# Patient Record
Sex: Male | Born: 1980 | Race: White | Hispanic: No | Marital: Married | Smoking: Never smoker
Health system: Southern US, Community
[De-identification: ages and names within clinical notes are randomized; demographics above are authoritative.]

## PROBLEM LIST (undated history)

## (undated) DIAGNOSIS — N200 Calculus of kidney: Secondary | ICD-10-CM

## (undated) DIAGNOSIS — M109 Gout, unspecified: Secondary | ICD-10-CM

## (undated) DIAGNOSIS — M1990 Unspecified osteoarthritis, unspecified site: Secondary | ICD-10-CM

## (undated) DIAGNOSIS — J40 Bronchitis, not specified as acute or chronic: Secondary | ICD-10-CM

## (undated) DIAGNOSIS — I10 Essential (primary) hypertension: Secondary | ICD-10-CM

## (undated) HISTORY — PX: APPENDECTOMY: SHX54

---

## 2007-02-01 ENCOUNTER — Ambulatory Visit: Payer: Self-pay | Admitting: Family Medicine

## 2007-12-29 ENCOUNTER — Ambulatory Visit (HOSPITAL_COMMUNITY): Admission: RE | Admit: 2007-12-29 | Discharge: 2007-12-29 | Payer: Self-pay | Admitting: Urology

## 2008-01-11 ENCOUNTER — Ambulatory Visit (HOSPITAL_COMMUNITY): Admission: RE | Admit: 2008-01-11 | Discharge: 2008-01-11 | Payer: Self-pay | Admitting: Urology

## 2008-01-12 ENCOUNTER — Ambulatory Visit (HOSPITAL_COMMUNITY): Admission: RE | Admit: 2008-01-12 | Discharge: 2008-01-12 | Payer: Self-pay | Admitting: Urology

## 2009-03-14 ENCOUNTER — Emergency Department (HOSPITAL_BASED_OUTPATIENT_CLINIC_OR_DEPARTMENT_OTHER): Admission: EM | Admit: 2009-03-14 | Discharge: 2009-03-14 | Payer: Self-pay | Admitting: Emergency Medicine

## 2009-04-17 IMAGING — CT CT ABDOMEN W/O CM
1 of 2 series · 16 of 32 positions shown, 20 images · non-contrast
Comparison: None available

CT ABDOMEN

CLINICAL DATA: Right flank pain, hematuria

CT ABDOMEN AND PELVIS WITHOUT CONTRAST
TECHNIQUE: Multidetector CT imaging of the abdomen and pelvis was
performed following the standard
protocol without intravenous contrast.

[Series 2: stone over (id) 5.0 b40f · axial · 0.86mm/px · z∈[-527,-67]mm · 16 of 102 slices shown, 20 images]
[im 5/102  soft-tissue]
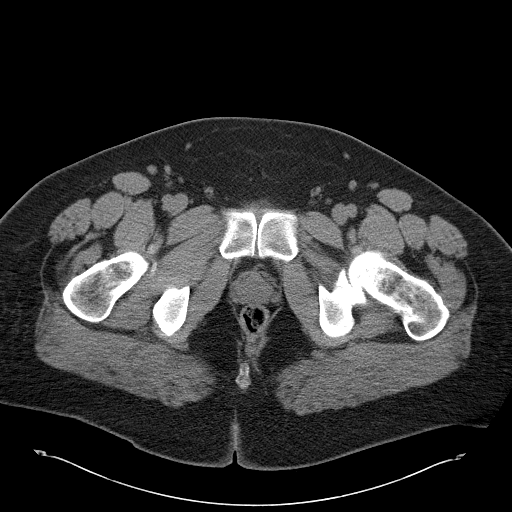
[im 5/102  bone]
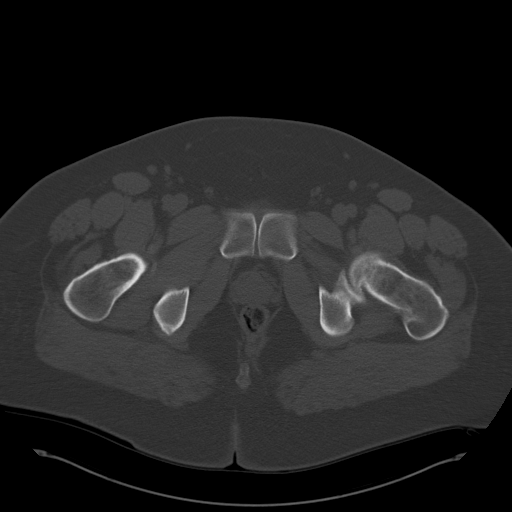
[im 13/102  soft-tissue]
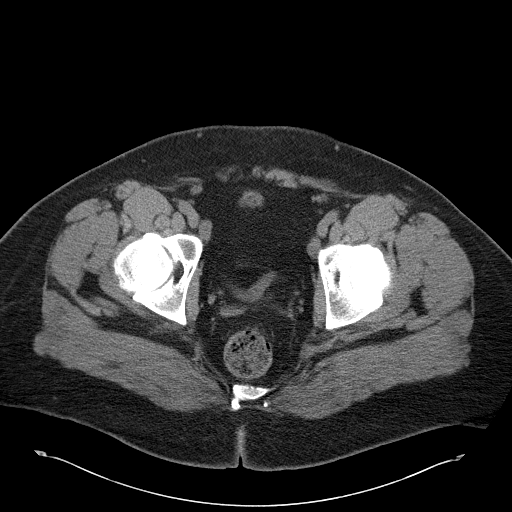
[im 22/102  soft-tissue]
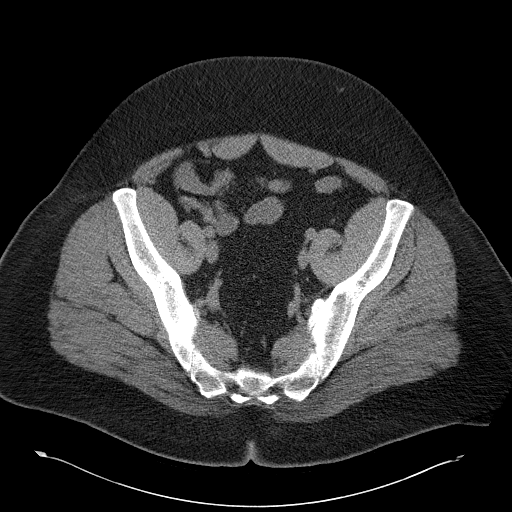
[im 26/102  soft-tissue]
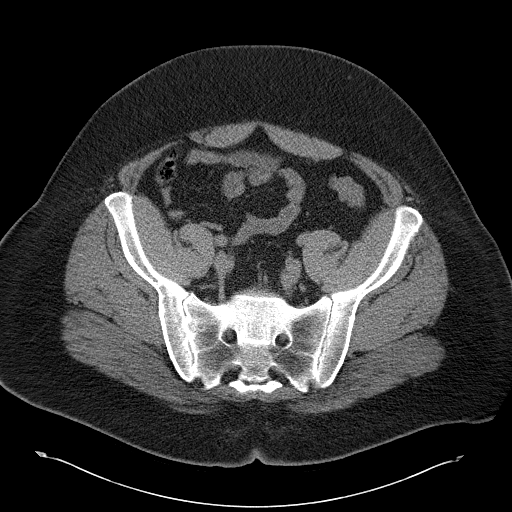
[im 34/102  soft-tissue]
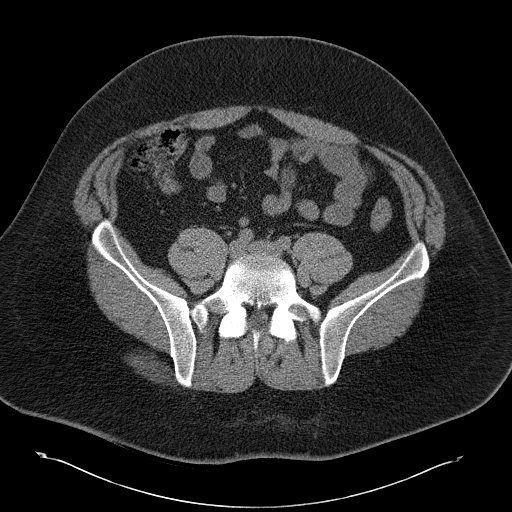
[im 43/102  soft-tissue]
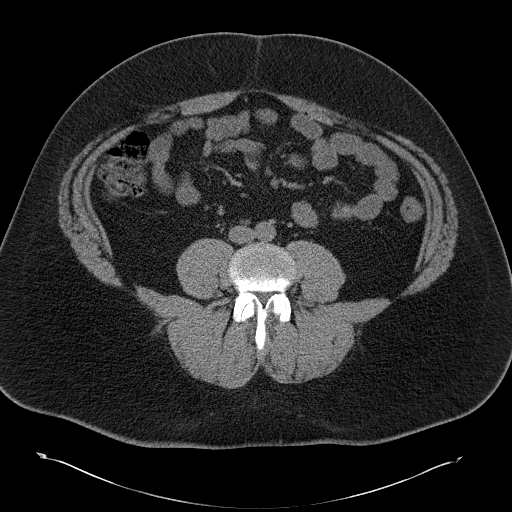
[im 47/102  soft-tissue]
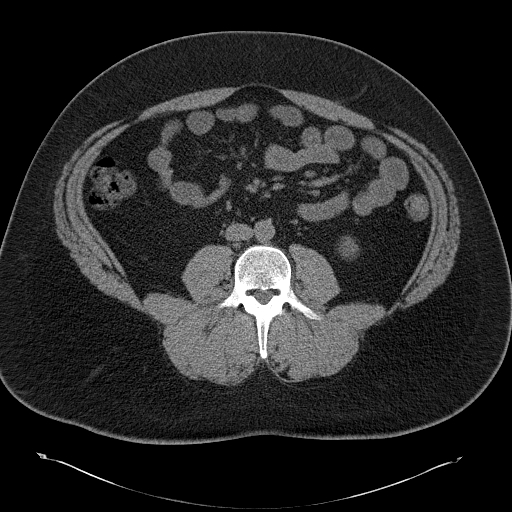
[im 55/102  soft-tissue]
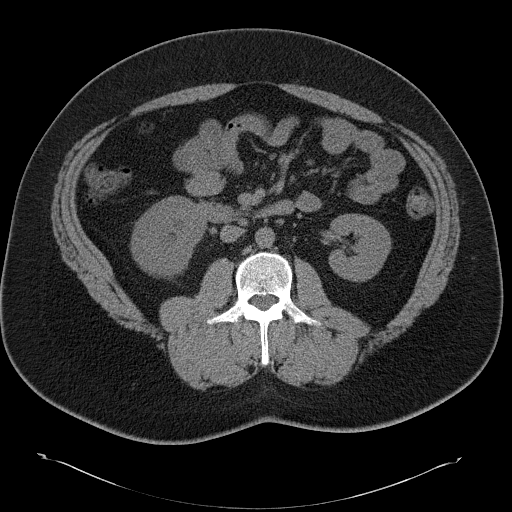
[im 59/102  soft-tissue]
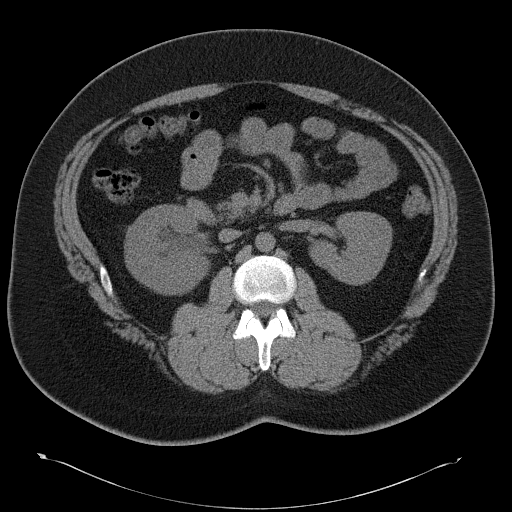
[im 59/102  bone]
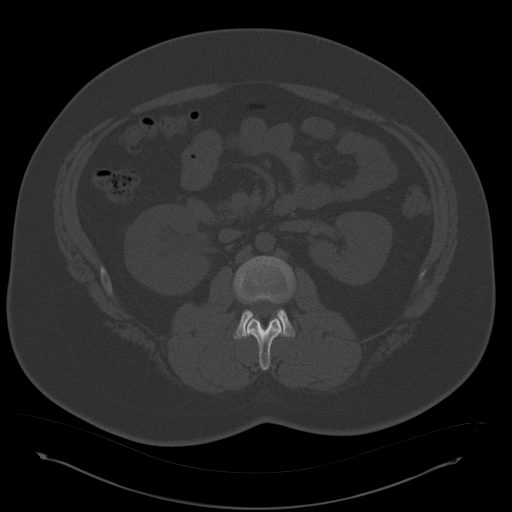
[im 68/102  soft-tissue]
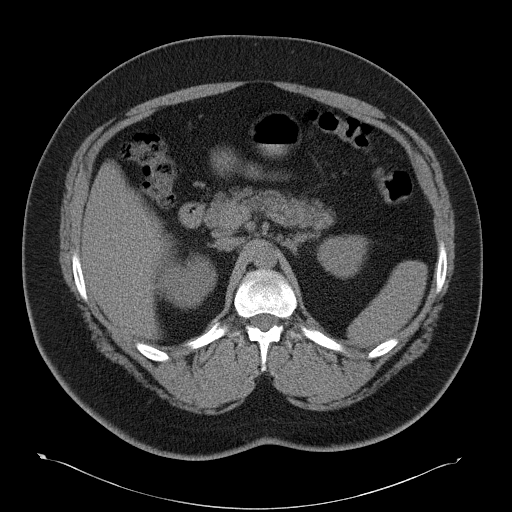
[im 76/102  soft-tissue]
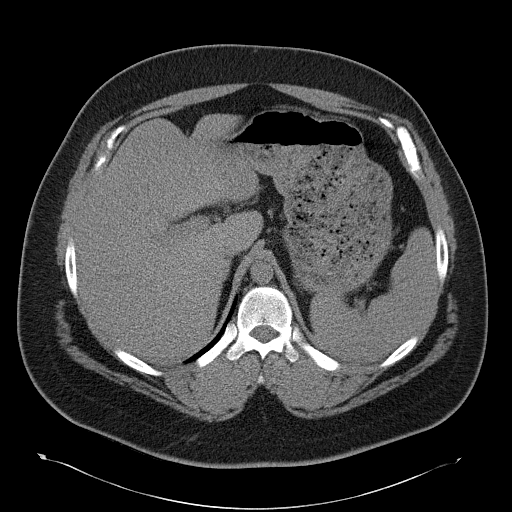
[im 80/102  soft-tissue]
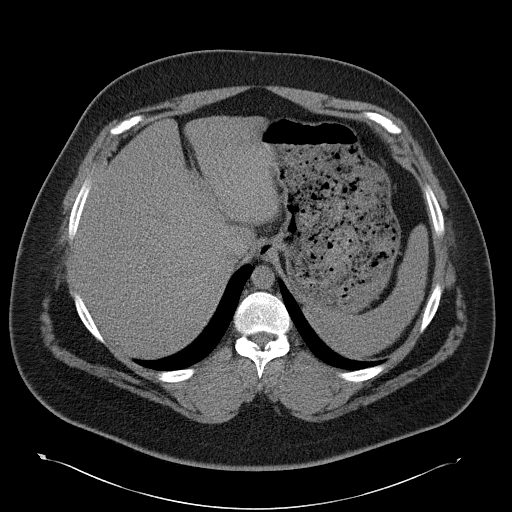
[im 85/102  lung]
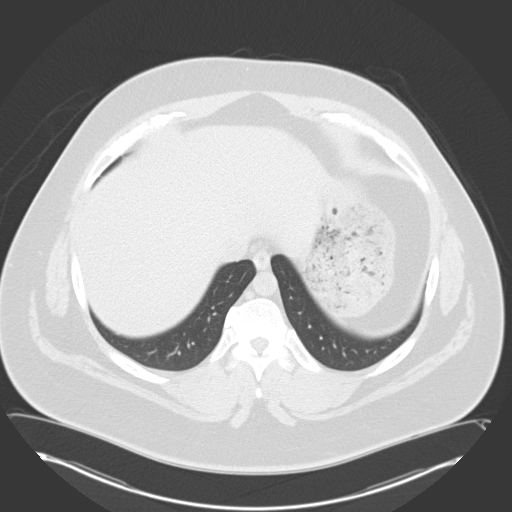
[im 89/102  soft-tissue]
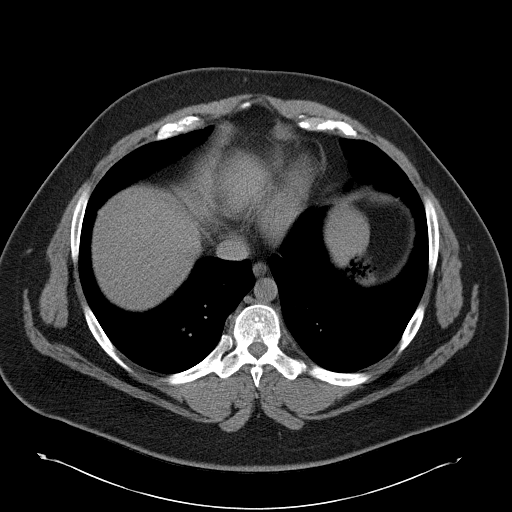
[im 89/102  lung]
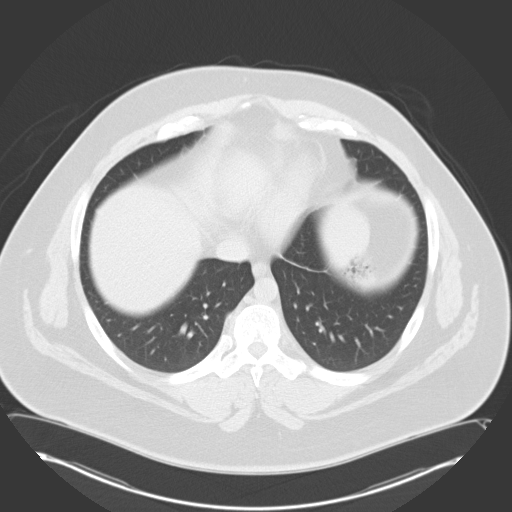
[im 93/102  lung]
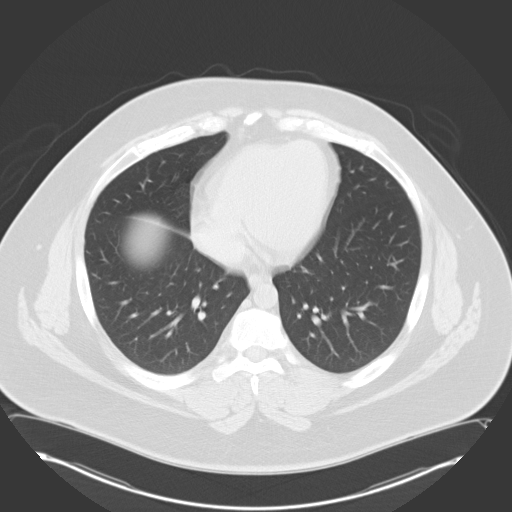
[im 97/102  soft-tissue]
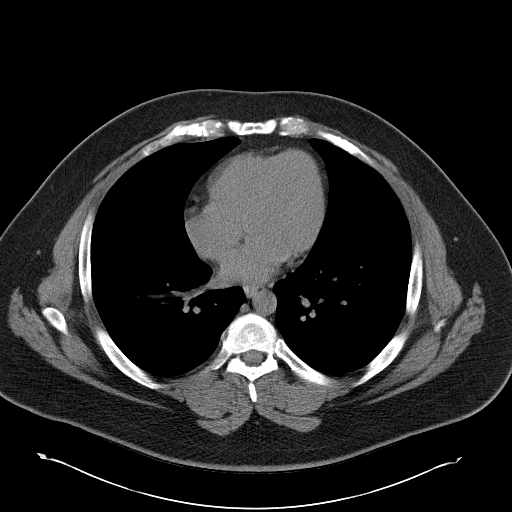
[im 97/102  lung]
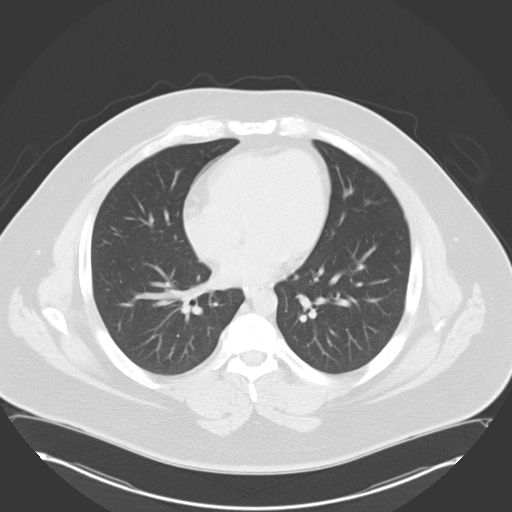

[16 of 32 positions shown; findings below may reference images not displayed]

FINDINGS: Visualized lung bases clear.  There is a  6 mm calculus
at  the right UPJ with mild hydronephrosis.  Unremarkable
noncontrast evaluation of left kidney, spleen, adrenal glands,
liver, gallbladder, pancreas, aorta, small bowel.  No free air.  No
ascites.  No adenopathy.
IMPRESSION: 1.  Obstructing right 6 mm UPJ calculus with hydronephrosis.

CT PELVIS
FINDINGS: The colon is nondistended, unremarkable.       Urinary
bladder is nondistended.  No free fluid.  No adenopathy.
IMPRESSION: 1.  Unremarkable CT pelvis

## 2009-05-01 IMAGING — CR DG ABDOMEN 1V
2 series · 2 of 2 positions shown · non-contrast
Comparison: One-view abdomen x-ray yesterday.  CT urogram
12/29/2007.

CLINICAL DATA: ESWL for right proximal ureteral calculus.

ABDOMEN - 1 VIEW 01/12/2008:

[view not recorded (1 of 2)]
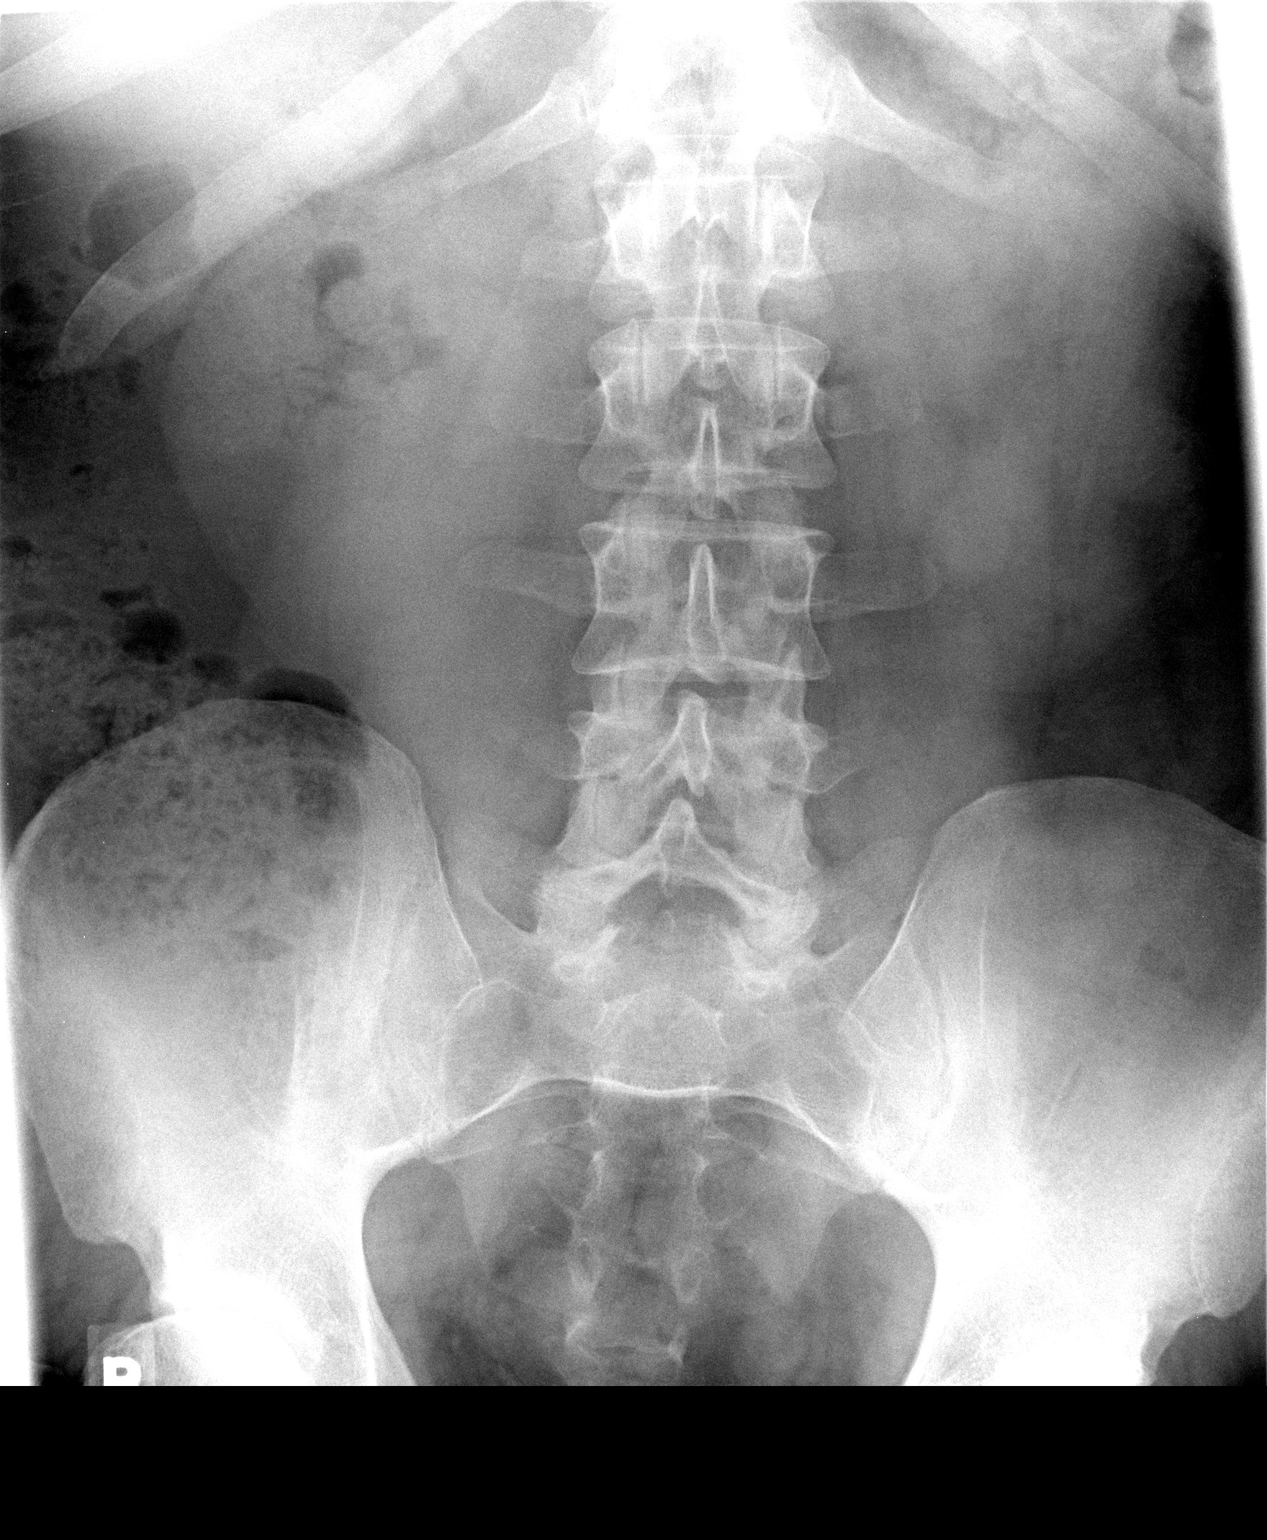

[view not recorded (2 of 2)]
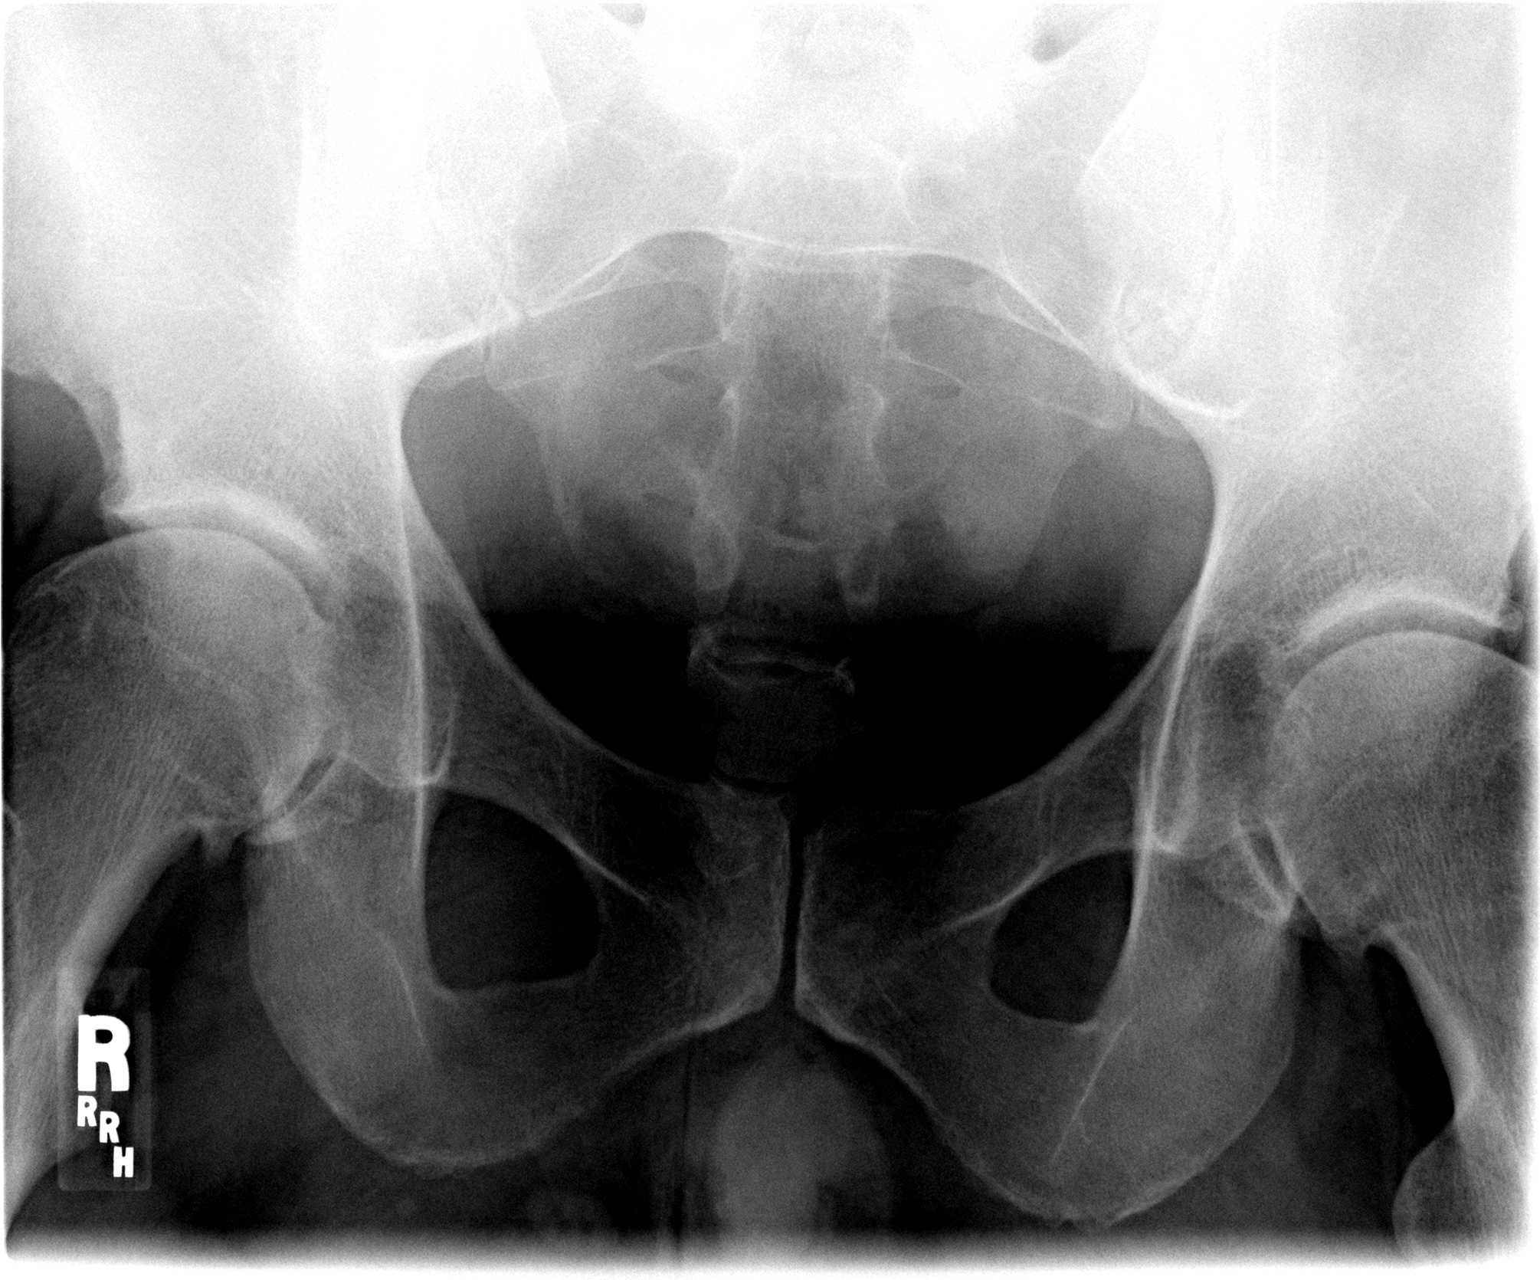

[2 of 2 positions shown; findings below may reference images not displayed]

FINDINGS: Proximal right ureteral calculus identified previously no
longer visible.  Linear calcifications in the right side of the low
pelvis may reflect small stone fragments in the right ureter.  No
visible opaque urinary tract calculi elsewhere.  Bowel gas pattern
unremarkable.  Regional skeleton intact.
IMPRESSION: Proximal right ureteral calculus no longer visible.  Possible small
stone fragments in the distal right ureter.

## 2009-06-06 ENCOUNTER — Emergency Department (HOSPITAL_BASED_OUTPATIENT_CLINIC_OR_DEPARTMENT_OTHER): Admission: EM | Admit: 2009-06-06 | Discharge: 2009-06-06 | Payer: Self-pay | Admitting: Emergency Medicine

## 2009-10-24 ENCOUNTER — Emergency Department (HOSPITAL_BASED_OUTPATIENT_CLINIC_OR_DEPARTMENT_OTHER): Admission: EM | Admit: 2009-10-24 | Discharge: 2009-10-24 | Payer: Self-pay | Admitting: Emergency Medicine

## 2009-12-26 ENCOUNTER — Emergency Department (HOSPITAL_BASED_OUTPATIENT_CLINIC_OR_DEPARTMENT_OTHER): Admission: EM | Admit: 2009-12-26 | Discharge: 2009-12-26 | Payer: Self-pay | Admitting: Emergency Medicine

## 2011-01-20 NOTE — H&P (Signed)
NAME:  Brian Page, Brian Page                ACCOUNT NO.:  [id number]   MEDICAL RECORD NO.:  [id number]          PATIENT TYPE:  AMB   LOCATION:  DAY                           FACILITY:  [facility]   PHYSICIAN:  Dennie Maizes, M.D.   DATE OF BIRTH:  1980/12/06   DATE OF ADMISSION:  01/11/2008  DATE OF DISCHARGE:  LH                              HISTORY & PHYSICAL   CHIEF COMPLAINT:  Hematuria, penile pain.   HISTORY OF PRESENT ILLNESS:  This 30 year old male was evaluated in the  emergency room at [facility] on December 19, 2007, with a  history of penile pain during coughing and intermittent hematuria.  He  has a past history of urolithiasis.  He has passed several stones in the  past.  He also has been having intermittent right flank pain for a few  days.  The patient denied having voiding difficulty, fever or chills.  Further evaluation was done with a CT scan of abdomen and pelvis.  This  revealed a 6-mm size stone in the right kidney at the ureteropelvic  junction with obstruction and mild hydronephrosis.  The patient is  brought to the short-stay center today for extracorporeal shock wave  lithotripsy of right renal calculus.   PAST MEDICAL HISTORY:  1. History of recurrent urolithiasis.  2. Gastroesophageal reflux disease.  3. Status post appendectomy.   MEDICATIONS:  Nexium 40 mg p.o. daily   ALLERGIES:  No known drug allergies.   FAMILY HISTORY:  Positive for kidney stones.   PHYSICAL EXAMINATION:  VITAL SIGNS:  Height 6 feet 1 inch, weight 300  pounds.  HEENT:  Normal.  NECK:  No masses.  LUNGS:  Clear to auscultation.  HEART:  Regular rate and rhythm.  No murmurs.  ABDOMEN:  Soft, no palpable flank mass.  Mild right costovertebral angle  tenderness is noted.  Bladder not palpable.  GENITALIA:  Penis and testes are normal.   IMPRESSION:  1. Right renal calculus with obstruction.  2. Right flank pain.  3. Hematuria.   PLAN:  Extracorporeal shock wave  lithotripsy with 6-mm size, right renal  calculus with IV sedation in the hospital.  I have discussed with the  patient regarding the diagnosis, operative details and outcome, possible  risks and complications and he has agreed for the procedure to be done.      Dennie Maizes, M.D.  Electronically Signed     SK/MEDQ  D:  01/11/2008  T:  01/11/2008  Job:  [id number]   cc:   [facility]  Fax: [phone]

## 2012-10-04 ENCOUNTER — Emergency Department (HOSPITAL_COMMUNITY)
Admission: EM | Admit: 2012-10-04 | Discharge: 2012-10-04 | Disposition: A | Discharge status: Home / Self Care | Payer: BC Managed Care – PPO | Attending: Emergency Medicine | Admitting: Emergency Medicine

## 2012-10-04 ENCOUNTER — Encounter (HOSPITAL_COMMUNITY): Payer: Self-pay | Admitting: *Deleted

## 2012-10-04 DIAGNOSIS — I10 Essential (primary) hypertension: Secondary | ICD-10-CM | POA: Insufficient documentation

## 2012-10-04 DIAGNOSIS — J329 Chronic sinusitis, unspecified: Secondary | ICD-10-CM | POA: Insufficient documentation

## 2012-10-04 DIAGNOSIS — Z8709 Personal history of other diseases of the respiratory system: Secondary | ICD-10-CM | POA: Insufficient documentation

## 2012-10-04 DIAGNOSIS — H9209 Otalgia, unspecified ear: Secondary | ICD-10-CM | POA: Insufficient documentation

## 2012-10-04 HISTORY — DX: Bronchitis, not specified as acute or chronic: J40

## 2012-10-04 HISTORY — DX: Essential (primary) hypertension: I10

## 2012-10-04 MED ORDER — BENZONATATE 100 MG PO CAPS
200.0000 mg | ORAL_CAPSULE | Freq: Once | ORAL | Status: AC
  Administered 2012-10-04: 200 mg via ORAL
  Filled 2012-10-04: qty 2

## 2012-10-04 MED ORDER — AMOXICILLIN 500 MG PO CAPS: 500.0000 mg | ORAL_CAPSULE | Freq: Three times a day (TID) | ORAL | Status: AC

## 2012-10-04 MED ORDER — BENZONATATE 100 MG PO CAPS: 100.0000 mg | ORAL_CAPSULE | Freq: Three times a day (TID) | ORAL | Status: DC | PRN

## 2012-10-04 NOTE — ED Notes (Signed)
Cough,dark brown sputum.  Nasal congestion,  No sore throat,  No fever.  No NVD.

## 2012-10-04 NOTE — ED Provider Notes (Signed)
Medical screening examination/treatment/procedure(s) were performed by non-physician practitioner and as supervising physician I was immediately available for consultation/collaboration.   Hye Trawick III, MD 10/04/12 1913 

## 2012-10-04 NOTE — ED Provider Notes (Signed)
History     CSN: [id number]  Arrival date & time 10/04/12  1512   First MD Initiated Contact with Patient 10/04/12 1528      Chief Complaint  Patient presents with  . Cough    (Consider location/radiation/quality/duration/timing/severity/associated sxs/prior treatment) HPI Comments: Brian Page presents with a 5 day history of nasal congestion with thick green discharge,  Post nasal drip and cough productive also of thick green sputum.  He also reports pressure sensation in the left ear without tinnitus or decreased hearing acuity.  He denies chest pain,  Shortness of breath, nausea, vomiting and abdominal pain.  He has taken dayquil without relief of symptoms.  He denies headache,  Visual changes, dizziness and sore throat.  The history is provided by the patient.    Past Medical History  Diagnosis Date  . Bronchitis   . Hypertension     Past Surgical History  Procedure Date  . Appendectomy     History reviewed. No pertinent family history.  History  Substance Use Topics  . Smoking status: Never Smoker   . Smokeless tobacco: Current User    Types: Chew  . Alcohol Use: No      Review of Systems  Constitutional: Negative for fever and chills.  HENT: Positive for ear pain, congestion, rhinorrhea and sinus pressure. Negative for sore throat, facial swelling, neck pain, neck stiffness and ear discharge.   Eyes: Negative.   Respiratory: Positive for cough. Negative for chest tightness, shortness of breath, wheezing and stridor.   Cardiovascular: Negative for chest pain.  Gastrointestinal: Negative for nausea and abdominal pain.  Genitourinary: Negative.   Musculoskeletal: Negative for joint swelling and arthralgias.  Skin: Negative.  Negative for rash and wound.  Neurological: Negative for dizziness, weakness, light-headedness, numbness and headaches.  Hematological: Negative.   Psychiatric/Behavioral: Negative.     Allergies  Review of patient's allergies  indicates no known allergies.  Home Medications   Current Outpatient Rx  Name  Route  Sig  Dispense  Refill  . DAYQUIL PO   Oral   Take by mouth.           BP 148/91  Pulse 85  Temp 98 F (36.7 C) (Oral)  SpO2 100%  Physical Exam  Nursing note and vitals reviewed. Constitutional: He appears well-developed and well-nourished.  HENT:  Head: Normocephalic and atraumatic.  Right Ear: Tympanic membrane, external ear and ear canal normal.  Left Ear: External ear and ear canal normal. Tympanic membrane is injected. Tympanic membrane is not bulging.  Nose: Left sinus exhibits frontal sinus tenderness.  Mouth/Throat: Uvula is midline, oropharynx is clear and moist and mucous membranes are normal.  Eyes: Conjunctivae normal are normal.  Neck: Normal range of motion.  Cardiovascular: Normal rate, regular rhythm, normal heart sounds and intact distal pulses.   Pulmonary/Chest: Effort normal and breath sounds normal. He has no wheezes.  Abdominal: Soft. Bowel sounds are normal. There is no tenderness.  Musculoskeletal: Normal range of motion.  Neurological: He is alert.  Skin: Skin is warm and dry.  Psychiatric: He has a normal mood and affect.    ED Course  Procedures (including critical care time)  Labs Reviewed - No data to display No results found.   1. Sinusitis       MDM  Possible early left otitis,  In addition to acute sinusitis.  Pt prescribed amoxil,  Tessalon for cough.  Encouraged continued dayquil for decongestant,  Humidifier.  Lungs clear on exam today,  Considered cxr,  But deferred.  Doubt pulmonary infection.        Burgess Amor, [place] 10/04/12 [phone]

## 2012-11-08 ENCOUNTER — Encounter (HOSPITAL_COMMUNITY): Payer: Self-pay | Admitting: *Deleted

## 2012-11-08 ENCOUNTER — Emergency Department (HOSPITAL_COMMUNITY)
Admission: EM | Admit: 2012-11-08 | Discharge: 2012-11-08 | Disposition: A | Discharge status: Home / Self Care | Payer: BC Managed Care – PPO | Attending: Emergency Medicine | Admitting: Emergency Medicine

## 2012-11-08 DIAGNOSIS — I10 Essential (primary) hypertension: Secondary | ICD-10-CM | POA: Insufficient documentation

## 2012-11-08 DIAGNOSIS — M25579 Pain in unspecified ankle and joints of unspecified foot: Secondary | ICD-10-CM | POA: Insufficient documentation

## 2012-11-08 DIAGNOSIS — Z79899 Other long term (current) drug therapy: Secondary | ICD-10-CM | POA: Insufficient documentation

## 2012-11-08 DIAGNOSIS — M109 Gout, unspecified: Secondary | ICD-10-CM | POA: Insufficient documentation

## 2012-11-08 DIAGNOSIS — Z8709 Personal history of other diseases of the respiratory system: Secondary | ICD-10-CM | POA: Insufficient documentation

## 2012-11-08 DIAGNOSIS — M064 Inflammatory polyarthropathy: Secondary | ICD-10-CM | POA: Insufficient documentation

## 2012-11-08 HISTORY — DX: Gout, unspecified: M10.9

## 2012-11-08 MED ORDER — DEXAMETHASONE 6 MG PO TABS: ORAL_TABLET | ORAL | Status: DC

## 2012-11-08 MED ORDER — HYDROCODONE-ACETAMINOPHEN 5-325 MG PO TABS: 1.0000 | ORAL_TABLET | ORAL | Status: DC | PRN

## 2012-11-08 MED ORDER — INDOMETHACIN 25 MG PO CAPS: 25.0000 mg | ORAL_CAPSULE | Freq: Three times a day (TID) | ORAL | Status: DC | PRN

## 2012-11-08 NOTE — ED Provider Notes (Signed)
History     CSN: [id number]  Arrival date & time 11/08/12  1716   First MD Initiated Contact with Patient 11/08/12 1822      Chief Complaint  Patient presents with  . Foot Pain    (Consider location/radiation/quality/duration/timing/severity/associated sxs/prior treatment) Patient is a 32 y.o. male presenting with lower extremity pain. The history is provided by the patient.  Foot Pain This is a recurrent problem. The current episode started 1 to 4 weeks ago. The problem occurs daily. The problem has been gradually worsening. Associated symptoms include arthralgias. Pertinent negatives include no abdominal pain, chest pain, coughing, fever, neck pain or numbness. The symptoms are aggravated by walking. He has tried acetaminophen for the symptoms. The treatment provided no relief.    Past Medical History  Diagnosis Date  . Bronchitis   . Hypertension   . Gout     Past Surgical History  Procedure Laterality Date  . Appendectomy      History reviewed. No pertinent family history.  History  Substance Use Topics  . Smoking status: Never Smoker   . Smokeless tobacco: Current User    Types: Chew  . Alcohol Use: No      Review of Systems  Constitutional: Negative for fever and activity change.       All ROS Neg except as noted in HPI  HENT: Negative for nosebleeds and neck pain.   Eyes: Negative for photophobia and discharge.  Respiratory: Negative for cough, shortness of breath and wheezing.   Cardiovascular: Negative for chest pain and palpitations.  Gastrointestinal: Negative for abdominal pain and blood in stool.  Genitourinary: Negative for dysuria, frequency and hematuria.  Musculoskeletal: Positive for arthralgias. Negative for back pain.  Skin: Negative.   Neurological: Negative for dizziness, seizures, speech difficulty and numbness.  Psychiatric/Behavioral: Negative for hallucinations and confusion.    Allergies  Review of patient's allergies indicates no  known allergies.  Home Medications   Current Outpatient Rx  Name  Route  Sig  Dispense  Refill  . acetaminophen (TYLENOL) 500 MG tablet   Oral   Take 1,500 mg by mouth daily as needed for pain.         Marland Kitchen dexamethasone (DECADRON) 6 MG tablet      1 po daily with food   7 tablet   0   . HYDROcodone-acetaminophen (NORCO/VICODIN) 5-325 MG per tablet   Oral   Take 1 tablet by mouth every 4 (four) hours as needed for pain.   20 tablet   0   . indomethacin (INDOCIN) 25 MG capsule   Oral   Take 1 capsule (25 mg total) by mouth 3 (three) times daily as needed.   21 capsule   0     BP 137/74  Pulse 85  Temp(Src) 97.4 F (36.3 C) (Oral)  Resp 18  Ht 6\' 1"  (1.854 m)  Wt 285 lb (129.275 kg)  BMI 37.61 kg/m2  SpO2 100%  Physical Exam  Nursing note and vitals reviewed. Constitutional: He is oriented to person, place, and time. He appears well-developed and well-nourished.  Non-toxic appearance.  HENT:  Head: Normocephalic.  Right Ear: Tympanic membrane and external ear normal.  Left Ear: Tympanic membrane and external ear normal.  Eyes: EOM and lids are normal. Pupils are equal, round, and reactive to light.  Neck: Normal range of motion. Neck supple. Carotid bruit is not present.  Cardiovascular: Normal rate, regular rhythm, normal heart sounds, intact distal pulses and normal pulses.  Pulmonary/Chest: Breath sounds normal. No respiratory distress.  Abdominal: Soft. Bowel sounds are normal. There is no tenderness. There is no guarding.  Musculoskeletal: Normal range of motion.  Pain of the MP of the  right 1st  Toe. Mild to mod increase redness. The left foot is warmer than the right. Pulses 2+.. No red streaking. No lesions between toes. No puncture wounds of the plantar surface.  Lymphadenopathy:       Head (right side): No submandibular adenopathy present.       Head (left side): No submandibular adenopathy present.    He has no cervical adenopathy.  Neurological: He  is alert and oriented to person, place, and time. He has normal strength. No cranial nerve deficit or sensory deficit.  Skin: Skin is warm and dry.  Psychiatric: He has a normal mood and affect. His speech is normal.    ED Course  Procedures (including critical care time)  Labs Reviewed - No data to display No results found.   1. Foot pain, right   2. Inflammatory arthritis       MDM  I have reviewed nursing notes, vital signs, and all appropriate lab and imaging results for this patient. Patient presents to the emergency department with pain of the right foot. Particularly pain is noted in the first toe of the right foot. This is been going on at intervals for the past week. The patient denies any recent injury or trauma to the toe. It is of note that the patient has gout history in the left foot.  The history and examination today are consistent with an inflammatory process involving the right foot, possibly gout. The patient is treated with Decadron 6 mg daily, Norco one every 4 hours #20 tablets, and Indocin 25 mg 3 times daily. Patient advised to see a primary physician for additional evaluation.       Kathie Dike, PA-C 11/08/12 [phone]

## 2012-11-08 NOTE — ED Notes (Signed)
Pt c/o right foot pain and swelling. Pt denies injury. Pt has hx of gout in left foot.

## 2012-11-08 NOTE — ED Notes (Signed)
Right foot pain, denies injury, hx of gout in left foot, states pain is same as before

## 2012-11-08 NOTE — ED Provider Notes (Signed)
Medical screening examination/treatment/procedure(s) were performed by non-physician practitioner and as supervising physician I was immediately available for consultation/collaboration.   Donald W Wickline, MD 11/08/12 1919 

## 2013-04-04 ENCOUNTER — Emergency Department (HOSPITAL_COMMUNITY): Payer: BC Managed Care – PPO

## 2013-04-04 ENCOUNTER — Emergency Department (HOSPITAL_COMMUNITY)
Admission: EM | Admit: 2013-04-04 | Discharge: 2013-04-04 | Disposition: A | Discharge status: Home / Self Care | Payer: BC Managed Care – PPO | Attending: Emergency Medicine | Admitting: Emergency Medicine

## 2013-04-04 ENCOUNTER — Encounter (HOSPITAL_COMMUNITY): Payer: Self-pay | Admitting: *Deleted

## 2013-04-04 DIAGNOSIS — Z79899 Other long term (current) drug therapy: Secondary | ICD-10-CM | POA: Insufficient documentation

## 2013-04-04 DIAGNOSIS — M109 Gout, unspecified: Secondary | ICD-10-CM | POA: Insufficient documentation

## 2013-04-04 DIAGNOSIS — Z87442 Personal history of urinary calculi: Secondary | ICD-10-CM | POA: Insufficient documentation

## 2013-04-04 DIAGNOSIS — I10 Essential (primary) hypertension: Secondary | ICD-10-CM | POA: Insufficient documentation

## 2013-04-04 DIAGNOSIS — N201 Calculus of ureter: Secondary | ICD-10-CM | POA: Insufficient documentation

## 2013-04-04 DIAGNOSIS — R319 Hematuria, unspecified: Secondary | ICD-10-CM

## 2013-04-04 HISTORY — DX: Calculus of kidney: N20.0

## 2013-04-04 LAB — URINALYSIS W MICROSCOPIC + REFLEX CULTURE
Bilirubin Urine: NEGATIVE
Specific Gravity, Urine: >1.03 — ABNORMAL HIGH (ref 1.005–1.030)
Urobilinogen, UA: 0.2 mg/dL (ref 0.0–1.0)
pH: 6 (ref 5.0–8.0)

## 2013-04-04 MED ORDER — ONDANSETRON HCL 4 MG PO TABS: 4.0000 mg | ORAL_TABLET | Freq: Three times a day (TID) | ORAL | Status: DC | PRN

## 2013-04-04 MED ORDER — HYDROCODONE-ACETAMINOPHEN 5-325 MG PO TABS: ORAL_TABLET | ORAL | Status: DC

## 2013-04-04 NOTE — ED Notes (Signed)
Hematuria x 3-4 days, getting worse.  Denies pain.

## 2013-04-04 NOTE — ED Provider Notes (Signed)
CSN: [id number]     Arrival date & time 04/04/13  1754 History     First MD Initiated Contact with Patient 04/04/13 1801     Chief Complaint  Patient presents with  . Hematuria    HPI Pt was seen at 1820.  Per pt, c/o gradual onset and persistence of constant hematuria for the past 3 to 4 days. States he has a hx of kidney stones and he is concerned regarding same today. Denies flank/back pain, no abd pain, no N/V/D, no dysuria, no testicular pain/swelling, no fevers, no rash.     Past Medical History  Diagnosis Date  . Bronchitis   . Hypertension   . Gout   . Kidney stones    Past Surgical History  Procedure Laterality Date  . Appendectomy      History  Substance Use Topics  . Smoking status: Never Smoker   . Smokeless tobacco: Current User    Types: Chew  . Alcohol Use: No    Review of Systems ROS: Statement: All systems negative except as marked or noted in the HPI; Constitutional: Negative for fever and chills. ; ; Eyes: Negative for eye pain, redness and discharge. ; ; ENMT: Negative for ear pain, hoarseness, nasal congestion, sinus pressure and sore throat. ; ; Cardiovascular: Negative for chest pain, palpitations, diaphoresis, dyspnea and peripheral edema. ; ; Respiratory: Negative for cough, wheezing and stridor. ; ; Gastrointestinal: Negative for nausea, vomiting, diarrhea, abdominal pain, blood in stool, hematemesis, jaundice and rectal bleeding. . ; ; Genitourinary: +hematuria. Negative for dysuria, flank pain. ; ; Genital:  No penile drainage or rash, no testicular pain or swelling, no scrotal rash or swelling.;; Musculoskeletal: Negative for back pain and neck pain. Negative for swelling and trauma.; ; Skin: Negative for pruritus, rash, abrasions, blisters, bruising and skin lesion.; ; Neuro: Negative for headache, lightheadedness and neck stiffness. Negative for weakness, altered level of consciousness , altered mental status, extremity weakness, paresthesias,  involuntary movement, seizure and syncope.      Allergies  Review of patient's allergies indicates no known allergies.  Home Medications   Current Outpatient Rx  Name  Route  Sig  Dispense  Refill  . acetaminophen (TYLENOL) 500 MG tablet   Oral   Take 1,500 mg by mouth daily as needed for pain.         Marland Kitchen dexamethasone (DECADRON) 6 MG tablet      1 po daily with food   7 tablet   0   . HYDROcodone-acetaminophen (NORCO/VICODIN) 5-325 MG per tablet   Oral   Take 1 tablet by mouth every 4 (four) hours as needed for pain.   20 tablet   0   . indomethacin (INDOCIN) 25 MG capsule   Oral   Take 1 capsule (25 mg total) by mouth 3 (three) times daily as needed.   21 capsule   0    BP 129/89  Pulse 81  Temp(Src) 98.6 F (37 C) (Oral)  Resp 18  Ht 6\' 1"  (1.854 m)  Wt 295 lb (133.811 kg)  BMI 38.93 kg/m2  SpO2 97% Physical Exam 1825: Physical examination:  Nursing notes reviewed; Vital signs and O2 SAT reviewed;  Constitutional: Well developed, Well nourished, Well hydrated, In no acute distress; Head:  Normocephalic, atraumatic; Eyes: EOMI, PERRL, No scleral icterus; ENMT: Mouth and pharynx normal, Mucous membranes moist; Neck: Supple, Full range of motion, No lymphadenopathy; Cardiovascular: Regular rate and rhythm, No murmur, rub, or gallop; Respiratory: Breath sounds  clear & equal bilaterally, No rales, rhonchi, wheezes.  Speaking full sentences with ease, Normal respiratory effort/excursion; Chest: Nontender, Movement normal; Abdomen: Soft, Nontender, Nondistended, Normal bowel sounds; Genitourinary: No CVA tenderness; Spine:  No midline CS, TS, LS tenderness.;; Extremities: Pulses normal, No tenderness, No edema, No calf edema or asymmetry.; Neuro: AA&Ox3, Major CN grossly intact.  Speech clear. Climbs on and off stretcher easily by himself. Gait steady. No gross focal motor or sensory deficits in extremities.; Skin: Color normal, Warm, Dry.   ED Course   Procedures     MDM  MDM Reviewed: previous chart, nursing note and vitals Reviewed previous: labs Interpretation: labs and CT scan   Results for orders placed during the hospital encounter of 04/04/13  URINALYSIS W MICROSCOPIC + REFLEX CULTURE      Result Value Range   Color, Urine YELLOW  YELLOW   APPearance CLOUDY (*) CLEAR   Specific Gravity, Urine >1.030 (*) 1.005 - 1.030   pH 6.0  5.0 - 8.0   Glucose, UA NEGATIVE  NEGATIVE mg/dL   Hgb urine dipstick LARGE (*) NEGATIVE   Bilirubin Urine NEGATIVE  NEGATIVE   Ketones, ur NEGATIVE  NEGATIVE mg/dL   Protein, ur TRACE (*) NEGATIVE mg/dL   Urobilinogen, UA 0.2  0.0 - 1.0 mg/dL   Nitrite NEGATIVE  NEGATIVE   Leukocytes, UA NEGATIVE  NEGATIVE   WBC, UA 3-6  <3 WBC/hpf   RBC / HPF TOO NUMEROUS TO COUNT  <3 RBC/hpf   Bacteria, UA MANY (*) RARE   Casts GRANULAR CAST (*) NEGATIVE   Ct Abdomen Pelvis Wo Contrast 04/04/2013   *RADIOLOGY REPORT*  Clinical Data: Hematuria and history of renal calculi.  CT ABDOMEN AND PELVIS WITHOUT CONTRAST  Technique:  Multidetector CT imaging of the abdomen and pelvis was performed following the standard protocol without intravenous contrast.  Comparison: 12/29/2007  Findings: Small calculus measuring approximately 3 mm is located in the proximal right ureter at, or slightly below, the UPJ.  This is causing mild hydronephrosis.  Punctate nonobstructing calculus is present in the lower pole of the right kidney.  The left kidney shows no visible calculi.  The bladder is decompressed and unremarkable.  Unenhanced appearance of the liver, gallbladder, pancreas, spleen, adrenal glands and bowel are unremarkable.  No abnormal fluid collections are seen.  No evidence of abnormal masses or enlarged lymph nodes.  No hernias are identified.  Bony structures show degenerative disc disease at the L5-S1 level.  IMPRESSION: 3 mm calculus of the proximal right ureter immediately below the UPJ and causing mild hydronephrosis.   Original  Report Authenticated By: Irish Lack, M.D.    1935:  Pt continues to deny any flank or abd pain. No N/V while in the ED. Appears very comfortable. Wants to go home now. No clear UTI on Udip, UC pending. Pt denies dysuria. Will tx symptomatically, f/u with Uro MD. Dx and testing d/w pt and family.  Questions answered.  Verb understanding, agreeable to d/c home with outpt f/u.   Laray Anger, DO 04/05/13 Elfredia Nevins

## 2013-04-05 LAB — URINE CULTURE: Culture: NO GROWTH

## 2013-06-19 ENCOUNTER — Encounter (HOSPITAL_COMMUNITY): Payer: Self-pay | Admitting: Emergency Medicine

## 2013-06-19 ENCOUNTER — Emergency Department (HOSPITAL_COMMUNITY)
Admission: EM | Admit: 2013-06-19 | Discharge: 2013-06-19 | Disposition: A | Discharge status: Home / Self Care | Payer: BC Managed Care – PPO | Attending: Emergency Medicine | Admitting: Emergency Medicine

## 2013-06-19 DIAGNOSIS — M25579 Pain in unspecified ankle and joints of unspecified foot: Secondary | ICD-10-CM | POA: Insufficient documentation

## 2013-06-19 DIAGNOSIS — Z8709 Personal history of other diseases of the respiratory system: Secondary | ICD-10-CM | POA: Insufficient documentation

## 2013-06-19 DIAGNOSIS — M109 Gout, unspecified: Secondary | ICD-10-CM | POA: Insufficient documentation

## 2013-06-19 DIAGNOSIS — Z87442 Personal history of urinary calculi: Secondary | ICD-10-CM | POA: Insufficient documentation

## 2013-06-19 DIAGNOSIS — I10 Essential (primary) hypertension: Secondary | ICD-10-CM | POA: Insufficient documentation

## 2013-06-19 MED ORDER — INDOMETHACIN 25 MG PO CAPS: 25.0000 mg | ORAL_CAPSULE | Freq: Three times a day (TID) | ORAL | Status: DC | PRN

## 2013-06-19 MED ORDER — HYDROCODONE-ACETAMINOPHEN 5-325 MG PO TABS: 1.0000 | ORAL_TABLET | ORAL | Status: DC | PRN

## 2013-06-19 NOTE — ED Provider Notes (Signed)
CSN: [id number]     Arrival date & time 06/19/13  1202 History   First MD Initiated Contact with Patient 06/19/13 1317     Chief Complaint  Patient presents with  . Foot Pain   (Consider location/radiation/quality/duration/timing/severity/associated sxs/prior Treatment) Patient is a 32 y.o. male presenting with lower extremity pain. The history is provided by the patient.  Foot Pain This is a new problem. The current episode started 1 to 4 weeks ago. The problem occurs constantly. The problem has been gradually worsening. Pertinent negatives include no abdominal pain, chills, fever, headaches, nausea, sore throat or vomiting.   Riese Hellard Whittier is a 32 y.o. male who presents to the ED with pain in the medial aspect of the left ankle that started 2 weeks ago. He has a history of gout. He states that they drew blood and told him he had gout. He has an occasional flare up and this feels the same. He is planning to see a PCP but works so much has not had a change to see one yet.   Past Medical History  Diagnosis Date  . Bronchitis   . Hypertension   . Gout   . Kidney stones    Past Surgical History  Procedure Laterality Date  . Appendectomy     No family history on file. History  Substance Use Topics  . Smoking status: Never Smoker   . Smokeless tobacco: Current User    Types: Chew  . Alcohol Use: No    Review of Systems  Constitutional: Negative for fever and chills.  HENT: Negative for sore throat.   Eyes: Negative for pain.  Gastrointestinal: Negative for nausea, vomiting and abdominal pain.  Musculoskeletal:       Left ankle pain  Skin: Negative for wound.  Allergic/Immunologic: Negative for immunocompromised state.  Neurological: Negative for headaches.  Psychiatric/Behavioral: The patient is not nervous/anxious.     Allergies  Review of patient's allergies indicates no known allergies.  Home Medications   Current Outpatient Rx  Name  Route  Sig  Dispense  Refill    . ibuprofen (ADVIL,MOTRIN) 200 MG tablet   Oral   Take 1,000 mg by mouth every 6 (six) hours as needed for pain.         . naproxen sodium (ALEVE) 220 MG tablet   Oral   Take 440 mg by mouth daily as needed (for pain).          BP 131/80  Pulse 78  Temp(Src) 98.8 F (37.1 C) (Oral)  Resp 18  Ht 6\' 1"  (1.854 m)  Wt 295 lb (133.811 kg)  BMI 38.93 kg/m2  SpO2 98% Physical Exam  Nursing note and vitals reviewed. Constitutional: He is oriented to person, place, and time. He appears well-developed and well-nourished. No distress.  HENT:  Head: Normocephalic and atraumatic.  Eyes: Conjunctivae and EOM are normal.  Neck: Normal range of motion. Neck supple.  Cardiovascular: Normal rate.   Pulmonary/Chest: Effort normal.  Musculoskeletal:       Left ankle: He exhibits decreased range of motion and swelling. He exhibits no deformity and normal pulse. Tenderness. Medial malleolus tenderness found.       Feet:  Tenderness and swelling to medial aspect of the left ankle. Pedal pulses strong and equal bilateral, adequate circulation, good touch sensation.   Neurological: He is alert and oriented to person, place, and time. No cranial nerve deficit.  Skin: Skin is warm and dry.  Psychiatric: He has a normal  mood and affect. His behavior is normal.    ED Course  Procedures  MDM  32 y.o. male with history of Gout and similar symptoms today. Will treat symptoms and patient is to follow up with PCP. He will return here as needed for worsening symptoms.  Discussed with the patient clinical findings and plan of care and all questioned fully answered.    Medication List    TAKE these medications       HYDROcodone-acetaminophen 5-325 MG per tablet  Commonly known as:  NORCO/VICODIN  Take 1 tablet by mouth every 4 (four) hours as needed.     indomethacin 25 MG capsule  Commonly known as:  INDOCIN  Take 1 capsule (25 mg total) by mouth 3 (three) times daily as needed.      ASK  your doctor about these medications       ALEVE 220 MG tablet  Generic drug:  naproxen sodium  Take 440 mg by mouth daily as needed (for pain).     ibuprofen 200 MG tablet  Commonly known as:  ADVIL,MOTRIN  Take 1,000 mg by mouth every 6 (six) hours as needed for pain.           Janne Napoleon, [place] 06/19/13 [phone]

## 2013-06-19 NOTE — ED Notes (Signed)
Pain lt foot for 2 weeks,  No known injury, Has hx of gout, Good DP pulse.  Pt says he has flare of gout  Yearly. No meds for gout taken.

## 2013-06-19 NOTE — ED Notes (Signed)
Complain of pain in left foot for two weeks

## 2013-06-20 NOTE — ED Provider Notes (Signed)
Medical screening examination/treatment/procedure(s) were performed by non-physician practitioner and as supervising physician I was immediately available for consultation/collaboration.   Trevontae Lindahl M Kendricks Reap, DO 06/20/13 0707 

## 2013-07-26 ENCOUNTER — Emergency Department (HOSPITAL_COMMUNITY)
Admission: EM | Admit: 2013-07-26 | Discharge: 2013-07-26 | Disposition: A | Discharge status: Home / Self Care | Payer: BC Managed Care – PPO | Attending: Emergency Medicine | Admitting: Emergency Medicine

## 2013-07-26 ENCOUNTER — Emergency Department (HOSPITAL_COMMUNITY): Payer: BC Managed Care – PPO

## 2013-07-26 ENCOUNTER — Encounter (HOSPITAL_COMMUNITY): Payer: Self-pay | Admitting: Emergency Medicine

## 2013-07-26 DIAGNOSIS — Z862 Personal history of diseases of the blood and blood-forming organs and certain disorders involving the immune mechanism: Secondary | ICD-10-CM | POA: Insufficient documentation

## 2013-07-26 DIAGNOSIS — J209 Acute bronchitis, unspecified: Secondary | ICD-10-CM

## 2013-07-26 DIAGNOSIS — J029 Acute pharyngitis, unspecified: Secondary | ICD-10-CM | POA: Insufficient documentation

## 2013-07-26 DIAGNOSIS — Z87442 Personal history of urinary calculi: Secondary | ICD-10-CM | POA: Insufficient documentation

## 2013-07-26 DIAGNOSIS — I10 Essential (primary) hypertension: Secondary | ICD-10-CM | POA: Insufficient documentation

## 2013-07-26 DIAGNOSIS — Z8639 Personal history of other endocrine, nutritional and metabolic disease: Secondary | ICD-10-CM | POA: Insufficient documentation

## 2013-07-26 MED ORDER — AZITHROMYCIN 250 MG PO TABS: ORAL_TABLET | ORAL | Status: DC

## 2013-07-26 MED ORDER — PREDNISONE (PAK) 10 MG PO TABS: ORAL_TABLET | Freq: Every day | ORAL | Status: DC

## 2013-07-26 MED ORDER — BENZONATATE 100 MG PO CAPS: 100.0000 mg | ORAL_CAPSULE | Freq: Three times a day (TID) | ORAL | Status: DC

## 2013-07-26 NOTE — ED Notes (Signed)
Pt c/o cough, productive at times, for the past 3 or 4 days.  Denies fever.

## 2013-07-26 NOTE — ED Provider Notes (Signed)
CSN: [id number]     Arrival date & time 07/26/13  1013 History   First MD Initiated Contact with Patient 07/26/13 1037     Chief Complaint  Patient presents with  . Cough   (Consider location/radiation/quality/duration/timing/severity/associated sxs/prior Treatment) Patient is a 32 y.o. male presenting with cough. The history is provided by the patient.  Cough Cough characteristics:  Productive Sputum characteristics:  Yellow Severity:  Moderate Onset quality:  Gradual Duration:  4 days Timing:  Sporadic Progression:  Worsening Chronicity:  New Smoker: no   Relieved by:  None tried Worsened by:  Deep breathing and lying down Ineffective treatments:  Rest and fluids Associated symptoms: rhinorrhea, sinus congestion and sore throat   Associated symptoms: no chest pain, no chills, no fever, no headaches, no rash, no shortness of breath and no wheezing    Brian Page is a 32 y.o. male who presents to the ED with cough cold and congestion that started about 4 days ago. He had the same thing about a year ago and was treated for Bronchitis with steroids, Tessalon and antibiotics.   Past Medical History  Diagnosis Date  . Bronchitis   . Hypertension   . Gout   . Kidney stones    Past Surgical History  Procedure Laterality Date  . Appendectomy     No family history on file. History  Substance Use Topics  . Smoking status: Never Smoker   . Smokeless tobacco: Current User    Types: Chew  . Alcohol Use: No    Review of Systems  Constitutional: Negative for fever and chills.  HENT: Positive for congestion, rhinorrhea, sinus pressure and sore throat. Negative for trouble swallowing.   Eyes: Negative for visual disturbance.  Respiratory: Positive for cough. Negative for shortness of breath and wheezing. Chest tightness: with cough.   Cardiovascular: Negative for chest pain.  Gastrointestinal: Negative for nausea, vomiting and abdominal pain.  Genitourinary: Negative for  dysuria and frequency.  Musculoskeletal: Negative for neck pain.  Skin: Negative for rash.  Neurological: Negative for dizziness, syncope and headaches.  Psychiatric/Behavioral: Negative for confusion. The patient is not nervous/anxious.     Allergies  Review of patient's allergies indicates no known allergies.  Home Medications   Current Outpatient Rx  Name  Route  Sig  Dispense  Refill  . ibuprofen (ADVIL,MOTRIN) 400 MG tablet   Oral   Take 800-1,200 mg by mouth 2 (two) times daily as needed (for gout.).         Marland Kitchen naproxen sodium (ALEVE) 220 MG tablet   Oral   Take 220 mg by mouth daily as needed (for pain).           BP 141/78  Pulse 87  Temp(Src) 98.2 F (36.8 C) (Oral)  Resp 20  Ht 6\' 1"  (1.854 m)  Wt 285 lb (129.275 kg)  BMI 37.61 kg/m2  SpO2 98% Physical Exam  Nursing note and vitals reviewed. Constitutional: He is oriented to person, place, and time. He appears well-developed and well-nourished. No distress.  HENT:  Head: Normocephalic and atraumatic.  Eyes: EOM are normal.  Neck: Neck supple.  Cardiovascular: Normal rate, regular rhythm and normal heart sounds.   Pulmonary/Chest: Effort normal. No respiratory distress. He has decreased breath sounds. He has no wheezes. Rhonchi: occasional. He has no rales.  Abdominal: Soft. There is no tenderness.  Musculoskeletal: Normal range of motion.  Neurological: He is alert and oriented to person, place, and time. No cranial nerve deficit.  Skin: Skin is warm and dry.  Psychiatric: He has a normal mood and affect. His behavior is normal.    ED Course  Procedures (including critical care time) Labs Review Labs Reviewed - No data to display Imaging Review Dg Chest 2 View  07/26/2013   CLINICAL DATA:  Cough, congestion.  EXAM: CHEST  2 VIEW  COMPARISON:  None.  FINDINGS: The heart size and mediastinal contours are within normal limits. Both lungs are clear. The visualized skeletal structures are unremarkable.   IMPRESSION: No active cardiopulmonary disease.   Electronically Signed   By: Salome Holmes M.D.   On: 07/26/2013 10:49    EKG Interpretation   None      MDM  32 y.o. male with acute bronchitis. No pneumonia on x-ray. Will treat with z-pak, prednisone and tessalon. He will follow up with his PCP or return here if symptoms worsen. Stable for discharge without any immediate complications. He will return for problems.  I have reviewed this patient's vital signs, nurses notes, appropriate imaging and discussed findings and plan of care with the patient.    Medication List    TAKE these medications       azithromycin 250 MG tablet  Commonly known as:  ZITHROMAX Z-PAK  Take 2 tablets PO now and then one tablet daily for infection     benzonatate 100 MG capsule  Commonly known as:  TESSALON  Take 1 capsule (100 mg total) by mouth every 8 (eight) hours.     predniSONE 10 MG tablet  Commonly known as:  STERAPRED UNI-PAK  Take by mouth daily. Take 6 tablets PO today then 5, 4, 3, 2, 1      ASK your doctor about these medications       ALEVE 220 MG tablet  Generic drug:  naproxen sodium  Take 220 mg by mouth daily as needed (for pain).     ibuprofen 400 MG tablet  Commonly known as:  ADVIL,MOTRIN  Take 800-1,200 mg by mouth 2 (two) times daily as needed (for gout.).             Janne Napoleon, NP 07/26/13 [phone]

## 2013-07-26 NOTE — ED Provider Notes (Signed)
Medical screening examination/treatment/procedure(s) were performed by non-physician practitioner and as supervising physician I was immediately available for consultation/collaboration.  EKG Interpretation   None        Dyllan Kats, MD 07/26/13 1611 

## 2013-12-21 ENCOUNTER — Encounter (HOSPITAL_COMMUNITY): Payer: Self-pay | Admitting: Emergency Medicine

## 2013-12-21 ENCOUNTER — Emergency Department (HOSPITAL_COMMUNITY)
Admission: EM | Admit: 2013-12-21 | Discharge: 2013-12-21 | Disposition: A | Discharge status: Home / Self Care | Payer: BC Managed Care – PPO | Attending: Emergency Medicine | Admitting: Emergency Medicine

## 2013-12-21 DIAGNOSIS — Z8709 Personal history of other diseases of the respiratory system: Secondary | ICD-10-CM | POA: Insufficient documentation

## 2013-12-21 DIAGNOSIS — Z792 Long term (current) use of antibiotics: Secondary | ICD-10-CM | POA: Insufficient documentation

## 2013-12-21 DIAGNOSIS — J029 Acute pharyngitis, unspecified: Secondary | ICD-10-CM | POA: Insufficient documentation

## 2013-12-21 DIAGNOSIS — Z8639 Personal history of other endocrine, nutritional and metabolic disease: Secondary | ICD-10-CM | POA: Insufficient documentation

## 2013-12-21 DIAGNOSIS — Z8739 Personal history of other diseases of the musculoskeletal system and connective tissue: Secondary | ICD-10-CM | POA: Insufficient documentation

## 2013-12-21 DIAGNOSIS — J329 Chronic sinusitis, unspecified: Secondary | ICD-10-CM | POA: Insufficient documentation

## 2013-12-21 DIAGNOSIS — IMO0001 Reserved for inherently not codable concepts without codable children: Secondary | ICD-10-CM | POA: Insufficient documentation

## 2013-12-21 DIAGNOSIS — Z87442 Personal history of urinary calculi: Secondary | ICD-10-CM | POA: Insufficient documentation

## 2013-12-21 DIAGNOSIS — H9209 Otalgia, unspecified ear: Secondary | ICD-10-CM | POA: Insufficient documentation

## 2013-12-21 DIAGNOSIS — Z862 Personal history of diseases of the blood and blood-forming organs and certain disorders involving the immune mechanism: Secondary | ICD-10-CM | POA: Insufficient documentation

## 2013-12-21 DIAGNOSIS — I10 Essential (primary) hypertension: Secondary | ICD-10-CM | POA: Insufficient documentation

## 2013-12-21 HISTORY — DX: Unspecified osteoarthritis, unspecified site: M19.90

## 2013-12-21 MED ORDER — CETIRIZINE-PSEUDOEPHEDRINE ER 5-120 MG PO TB12: 1.0000 | ORAL_TABLET | Freq: Two times a day (BID) | ORAL | Status: DC

## 2013-12-21 MED ORDER — SULFAMETHOXAZOLE-TRIMETHOPRIM 800-160 MG PO TABS: 1.0000 | ORAL_TABLET | Freq: Two times a day (BID) | ORAL | Status: AC

## 2013-12-21 NOTE — ED Provider Notes (Signed)
CSN: [id number]     Arrival date & time 12/21/13  1439 History   First MD Initiated Contact with Patient 12/21/13 1516     Chief Complaint  Patient presents with  . Nasal Congestion     (Consider location/radiation/quality/duration/timing/severity/associated sxs/prior Treatment) HPI Comments: Pt presents to the ED with c/o sore throat, nasal congestion, and body aches.He is not sure of temp elevation, but reports chills at times. He feels that it is hard to swallow at times. No rash. No n/v/d. Pt reports some headache.  The history is provided by the patient.    Past Medical History  Diagnosis Date  . Bronchitis   . Hypertension   . Gout   . Kidney stones   . Arthritis    Past Surgical History  Procedure Laterality Date  . Appendectomy     No family history on file. History  Substance Use Topics  . Smoking status: Never Smoker   . Smokeless tobacco: Current User    Types: Chew  . Alcohol Use: No    Review of Systems  Constitutional: Negative for activity change.       All ROS Neg except as noted in HPI  HENT: Positive for congestion, ear pain and sore throat. Negative for nosebleeds.   Eyes: Negative for photophobia and discharge.  Respiratory: Positive for cough. Negative for shortness of breath and wheezing.   Cardiovascular: Negative for chest pain and palpitations.  Gastrointestinal: Negative for abdominal pain and blood in stool.  Genitourinary: Negative for dysuria, frequency and hematuria.  Musculoskeletal: Positive for myalgias. Negative for arthralgias, back pain and neck pain.  Skin: Negative.   Neurological: Negative for dizziness, seizures and speech difficulty.  Psychiatric/Behavioral: Negative for hallucinations and confusion.      Allergies  Review of patient's allergies indicates no known allergies.  Home Medications   Prior to Admission medications   Medication Sig Start Date End Date Taking? Authorizing Provider  benzonatate (TESSALON) 100  MG capsule Take 1 capsule (100 mg total) by mouth every 8 (eight) hours. 07/26/13  Yes Hope Orlene OchM Neese, NP  DM-Phenylephrine-Acetaminophen (ALKA-SELTZER PLS SINUS & COUGH) 10-5-325 MG CAPS Take 2 capsules by mouth every 6 (six) hours as needed. Sinus and cough   Yes Historical Provider, MD  naproxen sodium (ALEVE) 220 MG tablet Take 220 mg by mouth daily as needed (for pain).    Yes Historical Provider, MD  cetirizine-pseudoephedrine (ZYRTEC-D) 5-120 MG per tablet Take 1 tablet by mouth 2 (two) times daily. 12/21/13   Kathie DikeHobson M Neomia Herbel, PA-C  sulfamethoxazole-trimethoprim (BACTRIM DS,SEPTRA DS) 800-160 MG per tablet Take 1 tablet by mouth 2 (two) times daily. 12/21/13 12/28/13  Kathie DikeHobson M Jorma Tassinari, PA-C   BP 147/87  Pulse 95  Temp(Src) 98.6 F (37 C)  Resp 18  Ht 6\' 1"  (1.854 m)  Wt 295 lb (133.811 kg)  BMI 38.93 kg/m2  SpO2 98% Physical Exam  Nursing note and vitals reviewed. Constitutional: He is oriented to person, place, and time. He appears well-developed and well-nourished.  Non-toxic appearance.  HENT:  Head: Normocephalic.  Right Ear: Tympanic membrane and external ear normal.  Left Ear: Tympanic membrane and external ear normal.  Mouth/Throat: Mucous membranes are normal. Uvula swelling present. Posterior oropharyngeal erythema present. No tonsillar abscesses.  There is moderate increased redness of the posterior pharynx. The uvula is enlarged, but in the midline. There is no evidence of abscess. The airway is patent, the speech is clear and understandable. Nasal congestion present.  Eyes: EOM and  lids are normal. Pupils are equal, round, and reactive to light.  Neck: Normal range of motion. Neck supple. Carotid bruit is not present.  Cardiovascular: Normal rate, regular rhythm, normal heart sounds, intact distal pulses and normal pulses.   Pulmonary/Chest: Breath sounds normal. No respiratory distress.  Abdominal: Soft. Bowel sounds are normal. There is no tenderness. There is no guarding.   Musculoskeletal: Normal range of motion.  Lymphadenopathy:       Head (right side): No submandibular adenopathy present.       Head (left side): No submandibular adenopathy present.    He has no cervical adenopathy.  Neurological: He is alert and oriented to person, place, and time. He has normal strength. No cranial nerve deficit or sensory deficit.  Skin: Skin is warm and dry.  Psychiatric: He has a normal mood and affect. His speech is normal.    ED Course  Procedures (including critical care time) Labs Review Labs Reviewed - No data to display  Imaging Review No results found.   EKG Interpretation None      MDM Vital signs stable. Pulse ox 98%on room air. Pt has a sinusitis and pharyngitis. Plan for Rx of zyrtec D and septra. Pt to  Increase fluids. He will use tylenol or ibuprofen for soreness.   Final diagnoses:  Sinusitis  Pharyngitis    **I have reviewed nursing notes, vital signs, and all appropriate lab and imaging results for this patient.Kathie Dike*    Preslyn Warr M [place] Kirchner, PA-C 12/21/13 [phone]

## 2013-12-21 NOTE — ED Provider Notes (Signed)
Medical screening examination/treatment/procedure(s) were performed by non-physician practitioner and as supervising physician I was immediately available for consultation/collaboration.   EKG Interpretation None        Marna Weniger L Jahzeel Poythress, MD 12/21/13 2009 

## 2013-12-21 NOTE — ED Notes (Signed)
Pt with nasal congestion, NP cough and sore throat for several days, denies fever or V/D, mild nausea

## 2013-12-21 NOTE — Discharge Instructions (Signed)

## 2013-12-21 NOTE — ED Notes (Signed)
Congestion and sore throat since monday.

## 2017-06-25 DIAGNOSIS — N200 Calculus of kidney: Secondary | ICD-10-CM | POA: Insufficient documentation

## 2017-06-25 DIAGNOSIS — I10 Essential (primary) hypertension: Secondary | ICD-10-CM | POA: Insufficient documentation

## 2017-06-25 DIAGNOSIS — G8929 Other chronic pain: Secondary | ICD-10-CM | POA: Insufficient documentation

## 2017-06-25 DIAGNOSIS — R0683 Snoring: Secondary | ICD-10-CM | POA: Insufficient documentation

## 2017-11-30 ENCOUNTER — Ambulatory Visit: Payer: Self-pay | Admitting: Urology

## 2017-12-28 ENCOUNTER — Ambulatory Visit: Payer: Self-pay | Admitting: Urology

## 2018-09-08 DIAGNOSIS — IMO0002 Reserved for concepts with insufficient information to code with codable children: Secondary | ICD-10-CM | POA: Insufficient documentation

## 2021-03-04 ENCOUNTER — Telehealth: Payer: Self-pay | Admitting: Radiology

## 2021-03-04 ENCOUNTER — Encounter: Payer: Self-pay | Admitting: Orthopaedic Surgery

## 2021-03-04 ENCOUNTER — Other Ambulatory Visit: Payer: Self-pay

## 2021-03-04 ENCOUNTER — Ambulatory Visit (INDEPENDENT_AMBULATORY_CARE_PROVIDER_SITE_OTHER): Payer: Managed Care, Other (non HMO) | Admitting: Orthopaedic Surgery

## 2021-03-04 VITALS — BP 171/107 | HR 107 | Ht 73.0 in | Wt 315.0 lb

## 2021-03-04 DIAGNOSIS — M1611 Unilateral primary osteoarthritis, right hip: Secondary | ICD-10-CM | POA: Diagnosis not present

## 2021-03-04 DIAGNOSIS — Z6841 Body Mass Index (BMI) 40.0 and over, adult: Secondary | ICD-10-CM | POA: Diagnosis not present

## 2021-03-04 DIAGNOSIS — E785 Hyperlipidemia, unspecified: Secondary | ICD-10-CM | POA: Insufficient documentation

## 2021-03-04 DIAGNOSIS — M169 Osteoarthritis of hip, unspecified: Secondary | ICD-10-CM | POA: Insufficient documentation

## 2021-03-04 DIAGNOSIS — Z79899 Other long term (current) drug therapy: Secondary | ICD-10-CM | POA: Insufficient documentation

## 2021-03-04 MED ORDER — MELOXICAM 15 MG PO TABS: 15.0000 mg | ORAL_TABLET | Freq: Every day | ORAL | 5 refills | Status: DC

## 2021-03-04 NOTE — Telephone Encounter (Signed)
Called patient his CD of hip images is at our front desk for pickup

## 2021-03-04 NOTE — Patient Instructions (Addendum)
Driving Directions to [employer] from [employer] address is [address] [place] The phone number is [phone]  Dr. Magnus Ivan office will call you with the appointment  1. Start out going [place] on [address] toward [address].  Then 0.02 miles0.02 total miles 2. Take the 1st right onto [address]/[place]. Continue to follow [address].  If you reach [facility] you've gone a little too far  Then 0.58 miles0.60 total miles 3. Turn right onto [address].  [place] is just past [address]  Then 2.25 miles2.85 total miles 4. Take the [address] ramp toward [place].  Then 0.25 miles3.10 total miles 5. Merge onto [address]  Then 18.17 miles21.28 total miles 6. Merge onto [address]  Then 1.47 miles22.74 total miles 7. Turn right onto [place].  [address] is just past [address]  Then 0.11 miles22.85 total miles  8. [address], [place], [place] [address], [phone] [address] is on the left.   NOTE FOR OUT OF WORK TODAY.

## 2021-03-04 NOTE — Progress Notes (Signed)
Subjective:    Patient ID: Brian Page, male    DOB: April 16, 1981, 40 y.o.   MRN: [id number]  HPI He has long history of right hip pain, some left hip pain and some lower back pain.  He has no trauma, no weakness.  He has been overweight and has lost down to 315 from 347 since the beginning of this year.  He has been seen by the [facility] in [place].  He has had problems moving the right hip.  He cannot cross his legs by putting his ankles on his knees.  He has pain after working a 12 hour shift.  He has tried various things to help and has no results. He has been on Mobic 15 which has helped the most.  I will refill this for him.  He brings X-rays showing significant degenerative changes of the right hip.  I have independently reviewed and interpreted x-rays of this patient done at another site by another physician or qualified health professional.    Review of Systems  Constitutional:  Positive for activity change.  Respiratory:  Positive for shortness of breath.   Musculoskeletal:  Positive for arthralgias, back pain, gait problem and myalgias.  All other systems reviewed and are negative. For Review of Systems, all other systems reviewed and are negative.  The following is a summary of the past history medically, past history surgically, known current medicines, social history and family history.  This information is gathered electronically by the computer from prior information and documentation.  I review this each visit and have found including this information at this point in the chart is beneficial and informative.   Past Medical History:  Diagnosis Date   Arthritis    Bronchitis    Gout    Hypertension    Kidney stones     Past Surgical History:  Procedure Laterality Date   APPENDECTOMY      Current Outpatient Medications on File Prior to Visit  Medication Sig Dispense Refill   atorvastatin (LIPITOR) 20 MG tablet Take 20 mg by mouth daily.     insulin NPH-regular  Human (NOVOLIN 70/30) (70-30) 100 UNIT/ML injection Inject 34 Units into the skin in the morning and at bedtime.     lisinopril (ZESTRIL) 20 MG tablet Take 20 mg by mouth daily.     naproxen sodium (ALEVE) 220 MG tablet Take 220 mg by mouth daily as needed (for pain).      No current facility-administered medications on file prior to visit.    Social History   Socioeconomic History   Marital status: Married    Spouse name: Not on file   Number of children: Not on file   Years of education: Not on file   Highest education level: Not on file  Occupational History   Not on file  Tobacco Use   Smoking status: Never   Smokeless tobacco: Current    Types: Chew  Substance and Sexual Activity   Alcohol use: No   Drug use: No   Sexual activity: Not on file  Other Topics Concern   Not on file  Social History Narrative   Not on file   Social Determinants of Health   Financial Resource Strain: Not on file  Food Insecurity: Not on file  Transportation Needs: Not on file  Physical Activity: Not on file  Stress: Not on file  Social Connections: Not on file  Intimate Partner Violence: Not on file    History reviewed.  No pertinent family history.  BP (!) 171/107   Pulse (!) 107   Ht 6\' 1"  (1.854 m)   Wt (!) 315 lb (142.9 kg)   BMI 41.56 kg/m   Body mass index is 41.56 kg/m.     Objective:   Physical Exam Vitals and nursing note reviewed. Exam conducted with a chaperone present.  Constitutional:      Appearance: He is well-developed.  HENT:     Head: Normocephalic and atraumatic.  Eyes:     Conjunctiva/sclera: Conjunctivae normal.     Pupils: Pupils are equal, round, and reactive to light.  Cardiovascular:     Rate and Rhythm: Normal rate and regular rhythm.  Pulmonary:     Effort: Pulmonary effort is normal.  Abdominal:     Palpations: Abdomen is soft.  Musculoskeletal:     Cervical back: Normal range of motion and neck supple.  Skin:    General: Skin is warm  and dry.  Neurological:     Mental Status: He is alert and oriented to person, place, and time.     Cranial Nerves: No cranial nerve deficit.     Motor: No abnormal muscle tone.     Coordination: Coordination normal.     Deep Tendon Reflexes: Reflexes are normal and symmetric. Reflexes normal.  Psychiatric:        Behavior: Behavior normal.        Thought Content: Thought content normal.        Judgment: Judgment normal.          Assessment & Plan:   Encounter Diagnoses  Name Primary?   Unilateral primary osteoarthritis, right hip Yes   Body mass index 40.0-44.9, adult (HCC)    Morbid obesity (HCC)    He is young to have this problem.  He says arthritis runs in the family early.  He has no known trauma.  He is overweight but has lost a good amount this year already.  I told him to continue.  He has essentially worn out his hip on the right.  It will get worse most likely over time.  I will have him see Dr. 06-20-1981 in [place] for evaluation. He is young and overweight but a possible candidate for total hip in the future.  I refilled the meloxicam 15.    Call if any problem.  Precautions discussed.  Electronically Signed [place], MD 6/28/202211:44 AM

## 2021-03-11 ENCOUNTER — Telehealth: Payer: Self-pay | Admitting: Radiology

## 2021-03-11 ENCOUNTER — Ambulatory Visit: Payer: Managed Care, Other (non HMO) | Admitting: Orthopaedic Surgery

## 2021-03-11 NOTE — Telephone Encounter (Signed)
FYI patient was a NO SHOW for their appointment on 03/11/21 with Dr.Blackman.  At this time they will not reach out to the patient to reschedule, we will close the referral.

## 2021-03-25 ENCOUNTER — Encounter: Payer: Self-pay | Admitting: Orthopaedic Surgery

## 2021-03-25 ENCOUNTER — Ambulatory Visit (INDEPENDENT_AMBULATORY_CARE_PROVIDER_SITE_OTHER): Payer: Managed Care, Other (non HMO) | Admitting: Orthopaedic Surgery

## 2021-03-25 DIAGNOSIS — M1611 Unilateral primary osteoarthritis, right hip: Secondary | ICD-10-CM | POA: Diagnosis not present

## 2021-03-25 DIAGNOSIS — M25551 Pain in right hip: Secondary | ICD-10-CM

## 2021-03-25 DIAGNOSIS — Z6841 Body Mass Index (BMI) 40.0 and over, adult: Secondary | ICD-10-CM

## 2021-03-25 DIAGNOSIS — M1612 Unilateral primary osteoarthritis, left hip: Secondary | ICD-10-CM | POA: Diagnosis not present

## 2021-03-25 MED ORDER — MELOXICAM 15 MG PO TABS: 15.0000 mg | ORAL_TABLET | Freq: Every day | ORAL | 3 refills | Status: AC | PRN

## 2021-03-25 NOTE — Progress Notes (Signed)
Office Visit Note   Patient: Brian Page           Date of Birth: March 06, 1981           MRN: [id number] Visit Date: 03/25/2021              Requested by: [name], MD [address] [place],  [place] [address] PCP: Waldon Reining, MD   Assessment & Plan: Visit Diagnoses:  1. Morbid obesity with body mass index (BMI) of 40.0 to 44.9 in adult [facility])   2. Pain in right hip   3. Unilateral primary osteoarthritis, right hip   4. Unilateral primary osteoarthritis, left hip     Plan: I spoke in length in detail about hip replacement surgery showing a hip model and explained in detail what the surgery involves.  We had a long and thorough discussion about the interoperative and postoperative course and the risk and benefits of surgery.  He understands that I do need him to lose a little bit more weight but mainly he needs to get his blood glucose under better control.  He states that he sees his primary care physician and August and he has been working on getting his blood glucose under better control.  He needs to send me the results of his A1c at his next visit and I would like to see him back in 6 weeks with a repeat weight and BMI calculation.  I gave him a handout about hip replacement surgery as well.  All question concerns were answered and addressed.  Follow-Up Instructions: Return in about 6 weeks (around 05/06/2021).   Orders:  No orders of the defined types were placed in this encounter.  No orders of the defined types were placed in this encounter.     Procedures: No procedures performed   Clinical Data: No additional findings.   Subjective: Chief Complaint  Patient presents with   Right Hip - Pain  The patient is a very pleasant 40 year old gentleman who comes in for evaluation treatment of known severe arthritis of his right hip.  He actually has arthritis in his left hip.  His pain is getting worse over the last several years and now he has significant  stiffness in both hips and knees having a lot of difficulty getting around in general.   been taking ibuprofen and some meloxicam occasionally for his pain and discomfort.  He is a diabetic and I saw a few years ago his hemoglobin A1c was 13.  He said his most recent was down to 8.1.  His BMI today is 41.23.  His hip pain is daily and it is detrimentally fighting his mobility, squat of life and his actives daily living.  It is 10 out of 10.  HPI  Review of Systems He currently denies any headache, chest pain, shortness of breath, fever, chills, nausea, vomiting  Objective: Vital Signs: Ht 6' 0.5" (1.842 m)   Wt (!) 308 lb 6.4 oz (139.9 kg)   BMI 41.25 kg/m   Physical Exam He is alert and oriented x3 and in no acute distress Ortho Exam Examination of both hips shows severe pain with attempts of internal and external rotation as well as significant stiffness with rotation of both hips.  When I have him lay in supine position he does have a large abdomen that we can easily move out of the way but does not hang over where the incision would be. Specialty Comments:  No specialty comments available.  Imaging: No results found. An AP pelvis shows both hips with severe end-stage arthritis with flattening of the femoral head and almost complete loss of the joint space.  There are large osteophytes around both hip joints and sclerotic changes.  PMFS History: Patient Active Problem List   Diagnosis Date Noted   Unilateral primary osteoarthritis, right hip 03/25/2021   Unilateral primary osteoarthritis, left hip 03/25/2021   Other long term (current) drug therapy 03/04/2021   Osteoarthritis of hip 03/04/2021   Hyperlipidemia, unspecified 03/04/2021   Uncontrolled type 2 diabetes mellitus with complication (HCC) 09/08/2018   Chronic bilateral low back pain without sciatica 06/25/2017   Essential hypertension 06/25/2017   Morbid obesity with body mass index (BMI) of 40.0 to 44.9 in adult [facility])  06/25/2017   Recurrent nephrolithiasis 06/25/2017   Snoring 06/25/2017   Past Medical History:  Diagnosis Date   Arthritis    Bronchitis    Gout    Hypertension    Kidney stones     History reviewed. No pertinent family history.  Past Surgical History:  Procedure Laterality Date   APPENDECTOMY     Social History   Occupational History   Not on file  Tobacco Use   Smoking status: Never   Smokeless tobacco: Current    Types: Chew  Substance and Sexual Activity   Alcohol use: No   Drug use: No   Sexual activity: Not on file

## 2021-05-06 ENCOUNTER — Ambulatory Visit: Payer: Managed Care, Other (non HMO) | Admitting: Orthopaedic Surgery

## 2021-05-20 ENCOUNTER — Ambulatory Visit: Payer: Managed Care, Other (non HMO) | Admitting: Orthopaedic Surgery

## 2021-06-19 ENCOUNTER — Other Ambulatory Visit: Payer: Self-pay

## 2021-06-19 ENCOUNTER — Ambulatory Visit (INDEPENDENT_AMBULATORY_CARE_PROVIDER_SITE_OTHER): Payer: Managed Care, Other (non HMO) | Admitting: Urology

## 2021-06-19 ENCOUNTER — Encounter: Payer: Self-pay | Admitting: Urology

## 2021-06-19 VITALS — BP 159/93 | HR 111 | Temp 98.7°F | Wt 303.7 lb

## 2021-06-19 DIAGNOSIS — N201 Calculus of ureter: Secondary | ICD-10-CM | POA: Diagnosis not present

## 2021-06-19 DIAGNOSIS — N200 Calculus of kidney: Secondary | ICD-10-CM

## 2021-06-19 LAB — URINALYSIS, ROUTINE W REFLEX MICROSCOPIC
Bilirubin, UA: NEGATIVE
Glucose, UA: NEGATIVE
Nitrite, UA: NEGATIVE
RBC, UA: NEGATIVE
Specific Gravity, UA: 1.025 (ref 1.005–1.030)
Urobilinogen, Ur: 2 mg/dL — ABNORMAL HIGH (ref 0.2–1.0)
pH, UA: 5.5 (ref 5.0–7.5)

## 2021-06-19 LAB — MICROSCOPIC EXAMINATION
RBC, Urine: NONE SEEN /hpf (ref 0–2)
Renal Epithel, UA: NONE SEEN /hpf

## 2021-06-19 NOTE — Progress Notes (Signed)
Assessment: 1. Ureteral calculus, right   2. Nephrolithiasis     Plan: I personally reviewed the patient's CT study from 06/11/2021 showing a 5 x 8 mm calculus in the mid right ureter with associated obstructive changes. KUB today - will call with results Diagnosis and management of ureteral calculus discussed with the patient.  Options for management including spontaneous passage, shockwave lithotripsy, ureteroscopy with stone manipulation discussed.  Following our discussion, he would like to continue to attempt spontaneous passage. Pain medicine as needed. Tamsulosin 0.4 mg daily  Strain urine. Return to office in 7-10 days with KUB prior to visit. Patient advised to call the office or seek medical attention at the emergency room for uncontrollable pain, vomiting, or fever/chills.  Chief Complaint:  Chief Complaint  Patient presents with   Nephrolithiasis     History of Present Illness:  Brian Page is a 40 y.o. year old male who is seen in consultation from Waldon Reining, MD for evaluation of right ureteral calculus.  He presented to the emergency room at [facility] on 06/11/2021 with right flank pain.  He reports passing a small kidney stone several days prior to this.  CT imaging showed a 8 mm calculus in the middle third of the right ureter with moderate proximal right hydroureteronephrosis and a tiny 2 mm nonobstructive calculus in the lower pole of the left kidney.  Urinalysis showed 4 RBCs and 20 WBCs.  He was treated with pain medication and tamsulosin. He has not been taking pain medication or tamsulosin.  He is not having any severe pain.  He does report occasional nausea but no vomiting.  No fevers or chills.  He does have urinary frequency and occasional gross hematuria.  No dysuria.  He has been straining his urine and has not passed a stone to his knowledge.  He has a history of recurrent nephrolithiasis.  He reports passing 15-20 stones.  He did undergo  shockwave lithotripsy approximately 14 years ago.  Past Medical History:  Past Medical History:  Diagnosis Date   Arthritis    Bronchitis    Gout    Hypertension    Kidney stones     Past Surgical History:  Past Surgical History:  Procedure Laterality Date   APPENDECTOMY      Allergies:  No Known Allergies  Family History:  No family history on file.  Social History:  Social History   Tobacco Use   Smoking status: Never   Smokeless tobacco: Current    Types: Chew  Substance Use Topics   Alcohol use: No   Drug use: No    Review of symptoms:  Constitutional:  Negative for unexplained weight loss, night sweats, fever, chills ENT:  Negative for nose bleeds, sinus pain, painful swallowing CV:  Negative for chest pain, shortness of breath, exercise intolerance, palpitations, loss of consciousness Resp:  Negative for cough, wheezing, shortness of breath GI:  Negative for vomiting, diarrhea, bloody stools: + nausea GU:  Positives noted in HPI; otherwise negative for dysuria, urinary incontinence; + gross hematuria Neuro:  Negative for seizures, poor balance, limb weakness, slurred speech Psych:  Negative for lack of energy, depression, anxiety Endocrine:  Negative for polydipsia, polyuria, symptoms of hypoglycemia (dizziness, hunger, sweating) Hematologic:  Negative for anemia, purpura, petechia, prolonged or excessive bleeding, use of anticoagulants  Allergic:  Negative for difficulty breathing or choking as a result of exposure to anything; no shellfish allergy; no allergic response (rash/itch) to materials, foods  Physical exam: BP Marland Kitchen)  159/93   Pulse (!) 111   Temp 98.7 F (37.1 C)   Wt (!) 303 lb 11.2 oz (137.8 kg)   BMI 40.62 kg/m  GENERAL APPEARANCE:  Well appearing, well developed, well nourished, NAD HEENT: Atraumatic, Normocephalic, oropharynx clear. NECK: Supple without lymphadenopathy or thyromegaly. LUNGS: Clear to auscultation bilaterally. HEART:  Regular Rate and Rhythm without murmurs, gallops, or rubs. ABDOMEN: Soft, non-tender, No Masses. EXTREMITIES: Moves all extremities well.  Without clubbing, cyanosis, or edema. NEUROLOGIC:  Alert and oriented x 3, normal gait, CN II-XII grossly intact.  MENTAL STATUS:  Appropriate. BACK:  Non-tender to palpation.  No CVAT SKIN:  Warm, dry and intact.    Results: U/A:  6-10 WBCs, 0-10 epithelial cells, few bacteria

## 2021-06-19 NOTE — Progress Notes (Signed)
Urological Symptom Review  Patient is experiencing the following symptoms: Frequent urination Stream starts and stops Blood in urine   Review of Systems  Gastrointestinal (upper)  : Negative for upper GI symptoms  Gastrointestinal (lower) : Negative for lower GI symptoms  Constitutional : Negative for symptoms  Skin: Negative for skin symptoms  Eyes: Negative for eye symptoms  Ear/Nose/Throat : Negative for Ear/Nose/Throat symptoms  Hematologic/Lymphatic: Negative for Hematologic/Lymphatic symptoms  Cardiovascular : Negative for cardiovascular symptoms  Respiratory : Negative for respiratory symptoms  Endocrine: Negative for endocrine symptoms  Musculoskeletal: Back pain  Neurological: Negative for neurological symptoms  Psychologic: Negative for psychiatric symptoms

## 2021-06-19 NOTE — Addendum Note (Signed)
Addended by: Ferdinand Lango on: 06/19/2021 12:56 PM   Modules accepted: Orders

## 2021-06-27 ENCOUNTER — Ambulatory Visit: Payer: Managed Care, Other (non HMO) | Admitting: Urology

## 2021-06-27 DIAGNOSIS — N201 Calculus of ureter: Secondary | ICD-10-CM

## 2021-06-27 DIAGNOSIS — N200 Calculus of kidney: Secondary | ICD-10-CM

## 2021-06-27 NOTE — Progress Notes (Deleted)
Assessment: 1. Nephrolithiasis   2. Ureteral calculus, right     Plan: KUB today - will call with results Diagnosis and management of ureteral calculus discussed with the patient.  Options for management including spontaneous passage, shockwave lithotripsy, ureteroscopy with stone manipulation discussed.  Following our discussion, he would like to continue to attempt spontaneous passage. Pain medicine as needed. Tamsulosin 0.4 mg daily  Strain urine. Return to office in 7-10 days with KUB prior to visit. Patient advised to call the office or seek medical attention at the emergency room for uncontrollable pain, vomiting, or fever/chills.  Chief Complaint:  No chief complaint on file.    History of Present Illness:  Brian Page is a 40 y.o. year old male who is seen for further evaluation of a right ureteral calculus.  He presented to the emergency room at [facility] on 06/11/2021 with right flank pain.  He reported passing a small kidney stone several days prior to this.  CT imaging showed a 8 mm calculus in the middle third of the right ureter with moderate proximal right hydroureteronephrosis and a tiny 2 mm nonobstructive calculus in the lower pole of the left kidney.  Urinalysis showed 4 RBCs and 20 WBCs.  He was treated with pain medication and tamsulosin. He has not been taking pain medication or tamsulosin.  At the time of his visit on 06/19/21, he was not having any severe pain.  He reported occasional nausea but no vomiting.  No fevers or chills.  He does have urinary frequency and occasional gross hematuria.  No dysuria.  He has been straining his urine and has not passed a stone to his knowledge.  He has a history of recurrent nephrolithiasis.  He reports passing 15-20 stones.  He did undergo shockwave lithotripsy approximately 14 years ago.  Past Medical History:  Past Medical History:  Diagnosis Date   Arthritis    Bronchitis    Gout    Hypertension    Kidney stones      Past Surgical History:  Past Surgical History:  Procedure Laterality Date   APPENDECTOMY      Allergies:  No Known Allergies  Family History:  No family history on file.  Social History:  Social History   Tobacco Use   Smoking status: Never   Smokeless tobacco: Current    Types: Chew  Substance Use Topics   Alcohol use: No   Drug use: No    ROS: Constitutional:  Negative for fever, chills, weight loss CV: Negative for chest pain, previous MI, hypertension Respiratory:  Negative for shortness of breath, wheezing, sleep apnea, frequent cough GI:  Negative for nausea, vomiting, bloody stool, GERD  Physical exam: There were no vitals taken for this visit. GENERAL APPEARANCE:  Well appearing, well developed, well nourished, NAD HEENT:  Atraumatic, normocephalic, oropharynx clear NECK:  Supple without lymphadenopathy or thyromegaly ABDOMEN:  Soft, non-tender, no masses EXTREMITIES:  Moves all extremities well, without clubbing, cyanosis, or edema NEUROLOGIC:  Alert and oriented x 3, normal gait, CN II-XII grossly intact MENTAL STATUS:  appropriate BACK:  Non-tender to palpation, No CVAT SKIN:  Warm, dry, and intact  Results: ***

## 2022-03-02 ENCOUNTER — Ambulatory Visit: Payer: Self-pay | Admitting: Orthopaedic Surgery

## 2023-02-24 ENCOUNTER — Ambulatory Visit: Payer: Self-pay | Admitting: Orthopaedic Surgery

## 2023-03-08 ENCOUNTER — Ambulatory Visit: Payer: Self-pay | Admitting: Physician Assistant

## 2023-03-22 ENCOUNTER — Ambulatory Visit (INDEPENDENT_AMBULATORY_CARE_PROVIDER_SITE_OTHER): Payer: 59 | Admitting: Physician Assistant

## 2023-03-22 ENCOUNTER — Encounter: Payer: Self-pay | Admitting: Physician Assistant

## 2023-03-22 DIAGNOSIS — M1612 Unilateral primary osteoarthritis, left hip: Secondary | ICD-10-CM | POA: Diagnosis not present

## 2023-03-22 DIAGNOSIS — M1611 Unilateral primary osteoarthritis, right hip: Secondary | ICD-10-CM

## 2023-03-22 NOTE — Progress Notes (Signed)
HPI: [name] comes in today wanting paperwork filled out for his physical assessment.  He has end-stage arthritis of both hips.  He is unable to undergo surgery at this point in time due to his hemoglobin A1c being high.  He had he quit work due to the pain in both hips.  He states that he has severe pain in both hips and cannot sit or stand for more than 30 minutes.  He is unable to walk any distance due to the hip pain.  Radiographs AP pelvis shows severe end-stage arthritis with flattening of the femoral head bilaterally.  Complete loss of joint spaces.  Large osteophytes bilateral hips with sclerotic changes.   Physical exam: General patient well-developed well-nourished male no acute distress. Bilateral hips: Virtually no internal or external rotation of either hip.  He has significant pain with attempts of internal/external rotation.  Impression: End-stage arthritis bilateral hips  Plan: Paperwork was filled out form today.  Surgery pending A1c and weight loss.
